# Patient Record
Sex: Female | Born: 1990
Health system: Southern US, Community
[De-identification: ages and names within clinical notes are randomized; demographics above are authoritative.]

## PROBLEM LIST (undated history)

## (undated) DIAGNOSIS — K0889 Other specified disorders of teeth and supporting structures: Secondary | ICD-10-CM

## (undated) DIAGNOSIS — O24419 Gestational diabetes mellitus in pregnancy, unspecified control: Secondary | ICD-10-CM

## (undated) DIAGNOSIS — A749 Chlamydial infection, unspecified: Secondary | ICD-10-CM

## (undated) DIAGNOSIS — N760 Acute vaginitis: Secondary | ICD-10-CM

## (undated) DIAGNOSIS — Z789 Other specified health status: Secondary | ICD-10-CM

## (undated) DIAGNOSIS — B9689 Other specified bacterial agents as the cause of diseases classified elsewhere: Secondary | ICD-10-CM

## (undated) HISTORY — DX: Other specified disorders of teeth and supporting structures: K08.89

## (undated) HISTORY — PX: NO PAST SURGERIES: SHX2092

## (undated) HISTORY — DX: Gestational diabetes mellitus in pregnancy, unspecified control: O24.419

---
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
## 2011-01-05 NOTE — Consult Note (Signed)
Summary: [facility]  [facility]   Imported By: Maryln Gottron 10/28/2010 13:47:49  _____________________________________________________________________  External Attachment:    Type:   Image     Comment:   External Document

## 2011-11-25 ENCOUNTER — Ambulatory Visit (INDEPENDENT_AMBULATORY_CARE_PROVIDER_SITE_OTHER): Payer: BC Managed Care – PPO | Admitting: Family Medicine

## 2011-11-25 ENCOUNTER — Ambulatory Visit: Payer: Self-pay | Admitting: Family Medicine

## 2011-11-25 ENCOUNTER — Encounter: Payer: Self-pay | Admitting: Family Medicine

## 2011-11-25 DIAGNOSIS — N898 Other specified noninflammatory disorders of vagina: Secondary | ICD-10-CM

## 2011-11-25 DIAGNOSIS — A499 Bacterial infection, unspecified: Secondary | ICD-10-CM

## 2011-11-25 DIAGNOSIS — B9689 Other specified bacterial agents as the cause of diseases classified elsewhere: Secondary | ICD-10-CM

## 2011-11-25 DIAGNOSIS — N760 Acute vaginitis: Secondary | ICD-10-CM

## 2011-11-25 MED ORDER — METRONIDAZOLE 0.75 % VA GEL: 1.0000 | Freq: Every day | VAGINAL | Status: AC

## 2011-11-25 NOTE — Progress Notes (Signed)
  Subjective:    Patient ID: Grace [name], female    DOB: 05/13/1991, 20 y.o.   MRN: [id number]  Vaginal Discharge The patient's primary symptoms include a vaginal discharge.   20 year old [place] American female in with complaints of vaginal discharge has been going on for 2 weeks. Denies any concerns or any sexually transmitted diseases. However she is sexually active with one female partner. In she denied any bleeding between her menstrual cycle. She is on Depo-Provera, no concerns of pregnancy. She has a history of bacterial vaginosis.   Review of Systems  Constitutional: Negative.   Respiratory: Negative.   Cardiovascular: Negative.   Gastrointestinal: Negative.   Genitourinary: Positive for vaginal discharge.       Fishy vaginal discharge  Psychiatric/Behavioral: Negative.    No past medical history on file.  History   Social History  . Marital Status: Single    Spouse Name: N/A    Number of Children: N/A  . Years of Education: N/A   Occupational History  . Not on file.   Social History Main Topics  . Smoking status: Former Smoker    Quit date: 10/28/2011  . Smokeless tobacco: Not on file   Comment: pt notes that she quit about 1 month ago  . Alcohol Use: Yes     twice a month  . Drug Use: No  . Sexually Active: Not on file   Other Topics Concern  . Not on file   Social History Narrative  . No narrative on file    No past surgical history on file.  No family history on file.  No Known Allergies  No current outpatient prescriptions on file prior to visit.    BP 100/64  Temp(Src) 97.7 F (36.5 C) (Oral)  Ht 5' 4.5" (1.638 m)  Wt 112 lb (50.803 kg)  BMI 18.93 kg/m2chart    Objective:   Physical Exam  Constitutional: She is oriented to person, place, and time. She appears well-developed and well-nourished.  Neck: Normal range of motion. Neck supple.  Cardiovascular: Normal rate and regular rhythm.   Pulmonary/Chest: Effort normal and breath sounds  normal.  Genitourinary: No bleeding around the vagina. Vaginal discharge found.       Frothy white vaginal discharge, malodorous   Musculoskeletal: Normal range of motion.  Neurological: She is alert and oriented to person, place, and time.          Assessment & Plan:  Assessment: Vaginal discharge, bacterial vaginosis  Plan: MetroGel vaginal one applicator at bedtime each bedtime x5 nights. No alcohol use during this time. Safe sex practices.

## 2011-11-25 NOTE — Patient Instructions (Signed)
Bacterial Vaginosis Bacterial vaginosis (BV) is a vaginal infection where the normal balance of bacteria in the vagina is disrupted. The normal balance is then replaced by an overgrowth of certain bacteria. There are several different kinds of bacteria that can cause BV. BV is the most common vaginal infection in women of childbearing age. CAUSES   The cause of BV is not fully understood. BV develops when there is an increase or imbalance of harmful bacteria.   Some activities or behaviors can upset the normal balance of bacteria in the vagina and put women at increased risk including:   Having a new sex partner or multiple sex partners.   Douching.   Using an intrauterine device (IUD) for contraception.   It is not clear what role sexual activity plays in the development of BV. However, women that have never had sexual intercourse are rarely infected with BV.  Women do not get BV from toilet seats, bedding, swimming pools or from touching objects around them.  SYMPTOMS   Grey vaginal discharge.   A fish-like odor with discharge, especially after sexual intercourse.   Itching or burning of the vagina and vulva.   Burning or pain with urination.   Some women have no signs or symptoms at all.  DIAGNOSIS  Your caregiver must examine the vagina for signs of BV. Your caregiver will perform lab tests and look at the sample of vaginal fluid through a microscope. They will look for bacteria and abnormal cells (clue cells), a pH test higher than 4.5, and a positive amine test all associated with BV.  RISKS AND COMPLICATIONS   Pelvic inflammatory disease (PID).   Infections following gynecology surgery.   Developing HIV.   Developing herpes virus.  TREATMENT  Sometimes BV will clear up without treatment. However, all women with symptoms of BV should be treated to avoid complications, especially if gynecology surgery is planned. Female partners generally do not need to be treated. However,  BV may spread between female sex partners so treatment is helpful in preventing a recurrence of BV.   BV may be treated with antibiotics. The antibiotics come in either pill or vaginal cream forms. Either can be used with nonpregnant or pregnant women, but the recommended dosages differ. These antibiotics are not harmful to the baby.   BV can recur after treatment. If this happens, a second round of antibiotics will often be prescribed.   Treatment is important for pregnant women. If not treated, BV can cause a premature delivery, especially for a pregnant woman who had a premature birth in the past. All pregnant women who have symptoms of BV should be checked and treated.   For chronic reoccurrence of BV, treatment with a type of prescribed gel vaginally twice a week is helpful.  HOME CARE INSTRUCTIONS   Finish all medication as directed by your caregiver.   Do not have sex until treatment is completed.   Tell your sexual partner that you have a vaginal infection. They should see their caregiver and be treated if they have problems, such as a mild rash or itching.   Practice safe sex. Use condoms. Only have 1 sex partner.  PREVENTION  Basic prevention steps can help reduce the risk of upsetting the natural balance of bacteria in the vagina and developing BV:  Do not have sexual intercourse (be abstinent).   Do not douche.   Use all of the medicine prescribed for treatment of BV, even if the signs and symptoms go away.     Tell your sex partner if you have BV. That way, they can be treated, if needed, to prevent reoccurrence.  SEEK MEDICAL CARE IF:   Your symptoms are not improving after 3 days of treatment.   You have increased discharge, pain, or fever.  MAKE SURE YOU:   Understand these instructions.   Will watch your condition.   Will get help right away if you are not doing well or get worse.  FOR MORE INFORMATION  Division of STD Prevention (DSTDP), Centers for Disease  Control and Prevention: www.cdc.gov/std American Social Health Association (ASHA): www.ashastd.org  Document Released: 11/22/2005 Document Revised: 08/04/2011 Document Reviewed: 05/15/2009 ExitCare Patient Information 2012 [employer]. 

## 2011-11-25 NOTE — Progress Notes (Signed)
Addended by: Beverely Low on: 11/25/2011 03:30 PM   Modules accepted: Orders

## 2011-11-26 LAB — WET PREP BY MOLECULAR PROBE: Trichomonas vaginosis: NEGATIVE

## 2011-11-28 ENCOUNTER — Encounter (HOSPITAL_COMMUNITY): Payer: Self-pay | Admitting: *Deleted

## 2011-11-28 ENCOUNTER — Emergency Department (HOSPITAL_COMMUNITY)
Admission: EM | Admit: 2011-11-28 | Discharge: 2011-11-28 | Discharge status: Against Medical Advice | Payer: BC Managed Care – PPO | Attending: Emergency Medicine | Admitting: Emergency Medicine

## 2011-11-28 DIAGNOSIS — R110 Nausea: Secondary | ICD-10-CM | POA: Insufficient documentation

## 2011-11-28 DIAGNOSIS — R109 Unspecified abdominal pain: Secondary | ICD-10-CM | POA: Insufficient documentation

## 2011-11-28 HISTORY — DX: Other specified bacterial agents as the cause of diseases classified elsewhere: N76.0

## 2011-11-28 HISTORY — DX: Other specified bacterial agents as the cause of diseases classified elsewhere: B96.89

## 2011-11-28 NOTE — ED Notes (Signed)
Informed md and pa patient decided to just go home.

## 2011-11-28 NOTE — ED Notes (Signed)
Pt is having lower abdominal pain and nausea with no vomitus.  Pt denies diarrhea.  Pt states that the pain is generalized across her lower abdomen.  Pt has recently been diagnosed with bacterial vaginosis and is being treated with flagyl gel.  Pt has not yet finished her treatment.

## 2011-12-07 LAB — GC/CHLAMYDIA PROBE AMP, GENITAL: Chlamydia, DNA Probe: POSITIVE — AB

## 2011-12-10 ENCOUNTER — Telehealth: Payer: Self-pay | Admitting: Family

## 2011-12-10 MED ORDER — DOXYCYCLINE HYCLATE 100 MG PO TABS: 100.0000 mg | ORAL_TABLET | Freq: Two times a day (BID) | ORAL | Status: DC

## 2011-12-10 NOTE — Telephone Encounter (Signed)
Pt aware and Rx sent to pharmacy. Pt verbalized understanding in sex education. Report faxed to [facility]

## 2011-12-10 NOTE — Telephone Encounter (Signed)
Doxycycline 100mg  1 po BID x 7 days # 14 no refills. Please advise that she has chlamydia. She should notify all sex partners. Refrain for sexual intercourse. Reports to the [facility].

## 2011-12-15 ENCOUNTER — Ambulatory Visit (INDEPENDENT_AMBULATORY_CARE_PROVIDER_SITE_OTHER): Payer: BC Managed Care – PPO | Admitting: Family

## 2011-12-15 ENCOUNTER — Encounter: Payer: Self-pay | Admitting: Family

## 2011-12-15 VITALS — Temp 98.4°F | Wt 112.0 lb

## 2011-12-15 DIAGNOSIS — G5600 Carpal tunnel syndrome, unspecified upper limb: Secondary | ICD-10-CM

## 2011-12-15 MED ORDER — DOXYCYCLINE HYCLATE 100 MG PO TABS: 100.0000 mg | ORAL_TABLET | Freq: Two times a day (BID) | ORAL | Status: AC

## 2011-12-15 NOTE — Progress Notes (Signed)
  Subjective:    Patient ID: Grace [name], female    DOB: 28-Mar-1991, 21 y.o.   MRN: [id number]  HPI In 21 year old African American female in with complaints left hand numbness and tingling when she woke up this morning. The numbness and tingling has improved. She is able to use her left hand and arm. She believes that she may have slept on it. She has not taken anything for relief. Denies any increase in activity with her left pain in, no sports activities, no increase in computer work. Patient denies any lightheadedness, dizziness, shortness of breath, chest pain, palpitations or edema.  The patient was recently diagnosed with Chlamydia, and has not had antibiotic filled due to cost. She will occur medication called him to [facility].  Review of Systems  Constitutional: Negative.   HENT: Negative.   Eyes: Negative.   Respiratory: Negative.   Cardiovascular: Negative.   Gastrointestinal: Negative.   Genitourinary: Negative.   Musculoskeletal: Negative.   Neurological: Negative.   Hematological: Negative.   Psychiatric/Behavioral: Negative.    Past Medical History  Diagnosis Date  . Bacterial vaginosis     History   Social History  . Marital Status: Single    Spouse Name: N/A    Number of Children: N/A  . Years of Education: N/A   Occupational History  . Not on file.   Social History Main Topics  . Smoking status: Former Smoker    Quit date: 10/28/2011  . Smokeless tobacco: Not on file   Comment: pt notes that she quit about 1 month ago  . Alcohol Use: Yes     twice a month  . Drug Use: No  . Sexually Active: Not on file   Other Topics Concern  . Not on file   Social History Narrative  . No narrative on file    No past surgical history on file.  Family History  Problem Relation Age of Onset  . Arthritis Maternal Grandmother   . Heart disease Maternal Grandmother   . Stroke Maternal Grandmother   . Hypertension Maternal Grandmother     No Known  Allergies  Current Outpatient Prescriptions on File Prior to Visit  Medication Sig Dispense Refill  . medroxyPROGESTERone (DEPO-PROVERA) 150 MG/ML injection Inject 150 mg into the muscle every 3 (three) months.         Temp(Src) 98.4 F (36.9 C) (Oral)  Wt 112 lb (50.803 kg)  LMP 12/23/2012chart    Objective:   Physical Exam  Constitutional: She is oriented to person, place, and time. She appears well-developed and well-nourished.  Neck: Normal range of motion. Neck supple.  Cardiovascular: Normal rate, regular rhythm and normal heart sounds.   Pulmonary/Chest: Effort normal and breath sounds normal.  Musculoskeletal: Normal range of motion.  Neurological: She is alert and oriented to person, place, and time.  Skin: Skin is warm and dry.  Psychiatric: She has a normal mood and affect.          Assessment & Plan:  Assessment: Carpal tunnel syndrome  Plan: Aleve over-the-counter twice daily. Patient to call if symptoms worsen or persist. Recheck as scheduled and when necessary will. Prescription for doxycycline written a cover Chlamydia. Spoke with patient at length about safe sex practices and sexually transmitted diseases. She is to avoid sexual contact for one week. Take antibiotics completely.

## 2011-12-15 NOTE — Patient Instructions (Signed)
Carpal Tunnel Syndrome The carpal tunnel is a narrow hollow area in the wrist. It is formed by the wrist bones and ligaments. Nerves, blood vessels, and tendons (cord like structures which attach muscle to bone) on the palm side (the side of your hand in the direction your fingers bend) of your hand pass through the carpal tunnel. Repeated wrist motion or certain diseases may cause swelling within the tunnel. (That is why these are called repetitive trauma (damage caused by over use) disorders. It is also a common problem in late pregnancy.) This swelling pinches the main nerve in the wrist (median nerve) and causes the painful condition called carpal tunnel syndrome. A feeling of "pins and needles" may be noticed in the fingers or hand; however, the entire arm may ache from this condition. Carpal tunnel syndrome may clear up by itself. Cortisone injections may help. Sometimes, an operation may be needed to free the pinched nerve. An electromyogram (a type of test) may be needed to confirm this diagnosis (learning what is wrong). This is a test which measures nerve conduction. The nerve conduction is usually slowed in a carpal tunnel syndrome. HOME CARE INSTRUCTIONS   If your caregiver prescribed medication to help reduce swelling, take as directed.   If you were given a splint to keep your wrist from bending, use it as instructed. It is important to wear the splint at night. Use the splint for as long as you have pain or numbness in your hand, arm or wrist. This may take 1 to 2 months.   If you have pain at night, it may help to rub or shake your hand, or elevate your hand above the level of your heart (the center of your chest).   It is important to give your wrist a rest by stopping the activities that are causing the problem. If your symptoms (problems) are work-related, you may need to talk to your employer about changing to a job that does not require using your wrist.   Only take over-the-counter  or prescription medicines for pain, discomfort, or fever as directed by your caregiver.   Following periods of extended use, particularly strenuous use, apply an ice pack wrapped in a towel to the anterior (palm) side of the affected wrist for 20 to 30 minutes. Repeat as needed three to four times per day. This will help reduce the swelling.   Follow all instructions for follow-up with your caregiver. This includes any orthopedic referrals, physical therapy, and rehabilitation. Any delay in obtaining necessary care could result in a delay or failure of your condition to heal.  SEEK IMMEDIATE MEDICAL CARE IF:   You are still having pain and numbness following a week of treatment.   You develop new, unexplained symptoms.   Your current symptoms are getting worse and are not helped or controlled with medications.  MAKE SURE YOU:   Understand these instructions.   Will watch your condition.   Will get help right away if you are not doing well or get worse.  Document Released: 11/19/2000 Document Revised: 08/04/2011 Document Reviewed: 06/26/2008 ExitCare Patient Information 2012 [employer]. 

## 2011-12-20 ENCOUNTER — Encounter: Payer: BC Managed Care – PPO | Admitting: Family

## 2011-12-20 DIAGNOSIS — Z0289 Encounter for other administrative examinations: Secondary | ICD-10-CM

## 2012-07-08 ENCOUNTER — Encounter (HOSPITAL_COMMUNITY): Payer: Self-pay | Admitting: *Deleted

## 2012-07-08 ENCOUNTER — Emergency Department (INDEPENDENT_AMBULATORY_CARE_PROVIDER_SITE_OTHER)
Admission: EM | Admit: 2012-07-08 | Discharge: 2012-07-08 | Disposition: A | Discharge status: Home / Self Care | Payer: BC Managed Care – PPO | Source: Home / Self Care | Attending: Emergency Medicine | Admitting: Emergency Medicine

## 2012-07-08 DIAGNOSIS — A499 Bacterial infection, unspecified: Secondary | ICD-10-CM

## 2012-07-08 DIAGNOSIS — B9689 Other specified bacterial agents as the cause of diseases classified elsewhere: Secondary | ICD-10-CM

## 2012-07-08 DIAGNOSIS — N760 Acute vaginitis: Secondary | ICD-10-CM

## 2012-07-08 HISTORY — DX: Chlamydial infection, unspecified: A74.9

## 2012-07-08 LAB — POCT URINALYSIS DIP (DEVICE)
Bilirubin Urine: NEGATIVE
Glucose, UA: NEGATIVE mg/dL
Hgb urine dipstick: NEGATIVE
Leukocytes, UA: NEGATIVE
Nitrite: NEGATIVE
Urobilinogen, UA: 1 mg/dL (ref 0.0–1.0)
pH: 7 (ref 5.0–8.0)

## 2012-07-08 LAB — WET PREP, GENITAL: Trich, Wet Prep: NONE SEEN

## 2012-07-08 MED ORDER — CEFTRIAXONE SODIUM 250 MG IJ SOLR
INTRAMUSCULAR | Status: AC
  Filled 2012-07-08: qty 250

## 2012-07-08 MED ORDER — LIDOCAINE HCL (PF) 1 % IJ SOLN
INTRAMUSCULAR | Status: AC
  Filled 2012-07-08: qty 5

## 2012-07-08 MED ORDER — METRONIDAZOLE 500 MG PO TABS: 500.0000 mg | ORAL_TABLET | Freq: Two times a day (BID) | ORAL | Status: AC

## 2012-07-08 MED ORDER — AZITHROMYCIN 250 MG PO TABS: 1000.0000 mg | ORAL_TABLET | Freq: Once | ORAL | Status: DC

## 2012-07-08 MED ORDER — CEFTRIAXONE SODIUM 250 MG IJ SOLR: 250.0000 mg | Freq: Once | INTRAMUSCULAR | Status: DC

## 2012-07-08 MED ORDER — AZITHROMYCIN 250 MG PO TABS
ORAL_TABLET | ORAL | Status: AC
  Filled 2012-07-08: qty 4

## 2012-07-08 NOTE — ED Notes (Signed)
Pt is here with complaints of vaginal pain with a creamy white discharge that started 2 days ago.  Pt says she is "unsure" of the last time she had unprotected sex.  History of chlamydia 5 months ago.  Denies additional symptoms.

## 2012-07-08 NOTE — ED Provider Notes (Signed)
History     CSN: [id number]  Arrival date & time 07/08/12  1520   First MD Initiated Contact with Patient 07/08/12 1556      Chief Complaint  Patient presents with  . Vaginal Pain    (Consider location/radiation/quality/duration/timing/severity/associated sxs/prior treatment) HPI Comments: Pt with 2 days oderous vaginal discharge,  No urgency, frequency, dysuria, oderous urine, hematuria,  genital blisters, vaginal itching. No fevers, N/V, abd pain, back pain. No recent abx use. Pt states that she has had no sexual contact since April.  STD's not a concern today. Similar sx before when had BV. Also history of Chlamydia.  No h/o gonorrhea, Trichomonas, yeast infection. syphilis, herpes, HIV.  ROS as noted in HPI. All other ROS negative.     Patient is a 21 y.o. female presenting with vaginal discharge. The history is provided by the patient. No language interpreter was used.  Vaginal Discharge This is a new problem. The current episode started more than 2 days ago. The problem occurs constantly. The problem has not changed since onset.Pertinent negatives include no abdominal pain. Nothing aggravates the symptoms. Nothing relieves the symptoms. She has tried nothing for the symptoms. The treatment provided no relief.    Past Medical History  Diagnosis Date  . Bacterial vaginosis   . Chlamydia     History reviewed. No pertinent past surgical history.  Family History  Problem Relation Age of Onset  . Arthritis Maternal Grandmother   . Heart disease Maternal Grandmother   . Stroke Maternal Grandmother   . Hypertension Maternal Grandmother     History  Substance Use Topics  . Smoking status: Former Smoker    Quit date: 10/28/2011  . Smokeless tobacco: Not on file   Comment: pt notes that she quit about 1 month ago  . Alcohol Use: Yes     twice a month    OB History    Grav Para Term Preterm Abortions TAB SAB Ect Mult Living                  Review of Systems    Gastrointestinal: Negative for abdominal pain.  Genitourinary: Positive for vaginal discharge.    Allergies  Review of patient's allergies indicates no known allergies.  Home Medications   Current Outpatient Rx  Name Route Sig Dispense Refill  . METRONIDAZOLE 500 MG PO TABS Oral Take 1 tablet (500 mg total) by mouth 2 (two) times daily. X 7 days 14 tablet 0    BP 124/87  Pulse 65  Temp 98.7 F (37.1 C) (Oral)  Resp 16  SpO2 100%  LMP 06/23/2012  Physical Exam  Nursing note and vitals reviewed. Constitutional: She is oriented to person, place, and time. She appears well-developed and well-nourished. No distress.  HENT:  Head: Normocephalic and atraumatic.  Eyes: EOM are normal.  Neck: Normal range of motion.  Cardiovascular: Normal rate.   Pulmonary/Chest: Effort normal.  Abdominal: Soft. Bowel sounds are normal. She exhibits no distension. There is no tenderness. There is no rebound, no guarding and no CVA tenderness.  Genitourinary: Uterus normal. Pelvic exam was performed with patient supine. There is no rash on the right labia. There is no rash on the left labia. Uterus is not tender. Cervix exhibits no motion tenderness and no friability. Right adnexum displays no mass, no tenderness and no fullness. Left adnexum displays no mass, no tenderness and no fullness. No erythema, tenderness or bleeding around the vagina. No foreign body around the vagina. Vaginal discharge  found.       Thin  white oderous  vaginal d/c.Chaperone present during exam  Musculoskeletal: Normal range of motion.  Neurological: She is alert and oriented to person, place, and time.  Skin: Skin is warm and dry.  Psychiatric: She has a normal mood and affect. Her behavior is normal. Judgment and thought content normal.    ED Course  Procedures (including critical care time)  Labs Reviewed  POCT URINALYSIS DIP (DEVICE) - Abnormal; Notable for the following:    Protein, ur 30 (*)     All other  components within normal limits  POCT PREGNANCY, URINE  WET PREP, GENITAL  GC/CHLAMYDIA PROBE AMP, GENITAL   No results found.   1. Bacterial vaginosis    Results for orders placed during the hospital encounter of 07/08/12  POCT URINALYSIS DIP (DEVICE)      Component Value Range   Glucose, UA NEGATIVE  NEGATIVE mg/dL   Bilirubin Urine NEGATIVE  NEGATIVE   Ketones, ur NEGATIVE  NEGATIVE mg/dL   Specific Gravity, Urine 1.020  1.005 - 1.030   Hgb urine dipstick NEGATIVE  NEGATIVE   pH 7.0  5.0 - 8.0   Protein, ur 30 (*) NEGATIVE mg/dL   Urobilinogen, UA 1.0  0.0 - 1.0 mg/dL   Nitrite NEGATIVE  NEGATIVE   Leukocytes, UA NEGATIVE  NEGATIVE  POCT PREGNANCY, URINE      Component Value Range   Preg Test, Ur NEGATIVE  NEGATIVE  WET PREP, GENITAL      Component Value Range   Yeast Wet Prep HPF POC NONE SEEN  NONE SEEN   Trich, Wet Prep NONE SEEN  NONE SEEN   Clue Cells Wet Prep HPF POC FEW (*) NONE SEEN   WBC, Wet Prep HPF POC NONE SEEN  NONE SEEN     MDM    H&P most c/wBV  Sent off GC/chlamydia, wet prep. Will not treat empirically now as pt denies sexual contact for 4 months. Has h/o BV. Will send home with flagyl. Advised pt to refrain from sexual contact until she he knows lab results, symptoms resolve, and partner(s) are treated. Pt provided working phone number. Pt agrees.     Luiz Blare, MD 07/08/12 2233

## 2012-07-10 LAB — GC/CHLAMYDIA PROBE AMP, GENITAL
Chlamydia, DNA Probe: NEGATIVE
GC Probe Amp, Genital: NEGATIVE

## 2012-07-21 ENCOUNTER — Telehealth (HOSPITAL_COMMUNITY): Payer: Self-pay | Admitting: *Deleted

## 2012-07-21 MED ORDER — FLUCONAZOLE 150 MG PO TABS: ORAL_TABLET | ORAL | Status: DC

## 2012-07-21 NOTE — ED Notes (Signed)
Pt. called and said she finished the Flagyl and now she has a yeast infection. She said this happened the last time she took it.  I told her I would call back.  Discussed with Dr. Tressia Danas and she ordered Diflucan 150 mg. PO now and repeat in 72 hrs.  She e-prescribed it to [facility] on [address] which was in system as her preferred pharmacy.  Pt had asked me to send it to [facility] on [facility] when she called. I tried to call her back to tell her where to pick it up and had to leave a message for her to call. Vassie Moselle 07/21/2012

## 2012-10-18 ENCOUNTER — Ambulatory Visit (INDEPENDENT_AMBULATORY_CARE_PROVIDER_SITE_OTHER): Payer: BC Managed Care – PPO | Admitting: Family

## 2012-10-18 ENCOUNTER — Encounter: Payer: Self-pay | Admitting: Family

## 2012-10-18 ENCOUNTER — Telehealth: Payer: Self-pay | Admitting: Family

## 2012-10-18 VITALS — BP 112/60 | HR 61 | Temp 98.0°F | Wt 114.0 lb

## 2012-10-18 DIAGNOSIS — L309 Dermatitis, unspecified: Secondary | ICD-10-CM

## 2012-10-18 DIAGNOSIS — L259 Unspecified contact dermatitis, unspecified cause: Secondary | ICD-10-CM

## 2012-10-18 DIAGNOSIS — B86 Scabies: Secondary | ICD-10-CM

## 2012-10-18 MED ORDER — TRIAMCINOLONE 0.1 % CREAM:EUCERIN CREAM 1:1: 1.0000 "application " | TOPICAL_CREAM | Freq: Two times a day (BID) | CUTANEOUS | Status: DC

## 2012-10-18 MED ORDER — PERMETHRIN 5 % EX CREA: TOPICAL_CREAM | Freq: Once | CUTANEOUS | Status: DC

## 2012-10-18 NOTE — Telephone Encounter (Signed)
Pt mother call back and can not afford itching medication cost 91.00. Per NP patient can use Nix otc. Pt mother is aware

## 2012-10-18 NOTE — Telephone Encounter (Signed)
[facility] called and needs clarification on the Qty and the Compound for Triamcinolone Acetonide (TRIAMCINOLONE 0.1 % CREAM : EUCERIN) CREA

## 2012-10-18 NOTE — Addendum Note (Signed)
Addended by: Beverely Low on: 10/18/2012 02:42 PM   Modules accepted: Orders

## 2012-10-18 NOTE — Progress Notes (Signed)
  Subjective:    Patient ID: Grace [name], female    DOB: 1991-04-26, 21 y.o.   MRN: [id number]  HPI 21 year old [place] American female, nonsmoker is in with complaints of itchy, bumpy rash has been present x2 weeks. The rash is worsening. Her boyfriend has a similar rash. She is concerned of scabies. Has had it many years ago.  Also has a rash to the neck that is dry, itchy, and flaking. She has not tried any OTC medications for relief. No history of allergies.    Review of Systems  Constitutional: Negative.   Respiratory: Negative.   Cardiovascular: Negative.   Musculoskeletal: Negative.   Skin: Positive for rash.       Itchy rash to the webs of the hands, feet, and right waste line.   Patchy rash to the neck   Psychiatric/Behavioral: Negative.    Past Medical History  Diagnosis Date  . Bacterial vaginosis   . Chlamydia     History   Social History  . Marital Status: Single    Spouse Name: N/A    Number of Children: N/A  . Years of Education: N/A   Occupational History  . Not on file.   Social History Main Topics  . Smoking status: Former Smoker    Quit date: 10/28/2011  . Smokeless tobacco: Not on file     Comment: pt notes that she quit about 1 month ago  . Alcohol Use: Yes     Comment: twice a month  . Drug Use: No  . Sexually Active: Yes     Comment: no sxual contact since April 2013   Other Topics Concern  . Not on file   Social History Narrative  . No narrative on file    No past surgical history on file.  Family History  Problem Relation Age of Onset  . Arthritis Maternal Grandmother   . Heart disease Maternal Grandmother   . Stroke Maternal Grandmother   . Hypertension Maternal Grandmother     No Known Allergies  Current Outpatient Prescriptions on File Prior to Visit  Medication Sig Dispense Refill  . fluconazole (DIFLUCAN) 150 MG tablet 1 tab po now repeat in 72h  2 tablet  0  . [DISCONTINUED] medroxyPROGESTERone (DEPO-PROVERA) 150  MG/ML injection Inject 150 mg into the muscle every 3 (three) months.         BP 112/60  Pulse 61  Temp 98 F (36.7 C) (Oral)  Wt 114 lb (51.71 kg)  SpO2 98%chart    Objective:   Physical Exam  Constitutional: She is oriented to person, place, and time. She appears well-developed and well-nourished.  Neck: Normal range of motion. Neck supple.  Cardiovascular: Normal rate, regular rhythm and normal heart sounds.   Pulmonary/Chest: Effort normal and breath sounds normal.  Abdominal: Soft. Bowel sounds are normal.  Neurological: She is alert and oriented to person, place, and time.  Skin: Rash noted.     Psychiatric: She has a normal mood and affect.          Assessment & Plan:  Assessment: Scabies, Eczema   Plan: Elimite apply to the entire surface of the body from head to toe, leave 8-12 hours then rinse. Triamcinolone and Eucerin cream 1:1 cream to the AA twice a day. Call the office if symptoms worsen or persist. Scabies precautions given.

## 2012-10-18 NOTE — Patient Instructions (Addendum)
Scabies Scabies are small bugs (mites) that burrow under the skin and cause red bumps and severe itching. These bugs can only be seen with a microscope. Scabies are highly contagious. They can spread easily from person to person by direct contact. They are also spread through sharing clothing or linens that have the scabies mites living in them. It is not unusual for an entire family to become infected through shared towels, clothing, or bedding.  HOME CARE INSTRUCTIONS   Your caregiver may prescribe a cream or lotion to kill the mites. If cream is prescribed, massage the cream into the entire body from the neck to the bottom of both feet. Also massage the cream into the scalp and face if your child is less than 1 year old. Avoid the eyes and mouth. Do not wash your hands after application.  Leave the cream on for 8 to 12 hours. Your child should bathe or shower after the 8 to 12 hour application period. Sometimes it is helpful to apply the cream to your child right before bedtime.  One treatment is usually effective and will eliminate approximately 95% of infestations. For severe cases, your caregiver may decide to repeat the treatment in 1 week. Everyone in your household should be treated with one application of the cream.  New rashes or burrows should not appear within 24 to 48 hours after successful treatment. However, the itching and rash may last for 2 to 4 weeks after successful treatment. Your caregiver may prescribe a medicine to help with the itching or to help the rash go away more quickly.  Scabies can live on clothing or linens for up to 3 days. All of your child's recently used clothing, towels, stuffed toys, and bed linens should be washed in hot water and then dried in a dryer for at least 20 minutes on high heat. Items that cannot be washed should be enclosed in a plastic bag for at least 3 days.  To help relieve itching, bathe your child in a cool bath or apply cool washcloths to the  affected areas.  Your child may return to school after treatment with the prescribed cream. SEEK MEDICAL CARE IF:   The itching persists longer than 4 weeks after treatment.  The rash spreads or becomes infected. Signs of infection include red blisters or yellow-tan crust. Document Released: 11/22/2005 Document Revised: 02/14/2012 Document Reviewed: 04/02/2009 [employer] Patient Information 2013 [employer]. Eczema Atopic dermatitis, or eczema, is an inherited type of sensitive skin. Often people with eczema have a family history of allergies, asthma, or hay fever. It causes a red itchy rash and dry scaly skin. The itchiness may occur before the skin rash and may be very intense. It is not contagious. Eczema is generally worse during the cooler winter months and often improves with the warmth of summer. Eczema usually starts showing signs in infancy. Some children outgrow eczema, but it may last through adulthood. Flare-ups may be caused by:  Eating something or contact with something you are sensitive or allergic to.  Stress. DIAGNOSIS  The diagnosis of eczema is usually based upon symptoms and medical history. TREATMENT  Eczema cannot be cured, but symptoms usually can be controlled with treatment or avoidance of allergens (things to which you are sensitive or allergic to).  Controlling the itching and scratching.  Use over-the-counter antihistamines as directed for itching. It is especially useful at night when the itching tends to be worse.  Use over-the-counter steroid creams as directed for itching.    Scratching makes the rash and itching worse and may cause impetigo (a skin infection) if fingernails are contaminated (dirty).  Keeping the skin well moisturized with creams every day. This will seal in moisture and help prevent dryness. Lotions containing alcohol and water can dry the skin and are not recommended.  Limiting exposure to allergens.  Recognizing situations that  cause stress.  Developing a plan to manage stress. HOME CARE INSTRUCTIONS   Take prescription and over-the-counter medicines as directed by your caregiver.  Do not use anything on the skin without checking with your caregiver.  Keep baths or showers short (5 minutes) in warm (not hot) water. Use mild cleansers for bathing. You may add non-perfumed bath oil to the bath water. It is best to avoid soap and bubble bath.  Immediately after a bath or shower, when the skin is still damp, apply a moisturizing ointment to the entire body. This ointment should be a petroleum ointment. This will seal in moisture and help prevent dryness. The thicker the ointment the better. These should be unscented.  Keep fingernails cut short and wash hands often. If your child has eczema, it may be necessary to put soft gloves or mittens on your child at night.  Dress in clothes made of cotton or cotton blends. Dress lightly, as heat increases itching.  Avoid foods that may cause flare-ups. Common foods include cow's milk, peanut butter, eggs and wheat.  Keep a child with eczema away from anyone with fever blisters. The virus that causes fever blisters (herpes simplex) can cause a serious skin infection in children with eczema. SEEK MEDICAL CARE IF:   Itching interferes with sleep.  The rash gets worse or is not better within one week following treatment.  The rash looks infected (pus or soft yellow scabs).  You or your child has an oral temperature above 102 F (38.9 C).  Your baby is older than 3 months with a rectal temperature of 100.5 F (38.1 C) or higher for more than 1 day.  The rash flares up after contact with someone who has fever blisters. SEEK IMMEDIATE MEDICAL CARE IF:   Your baby is older than 3 months with a rectal temperature of 102 F (38.9 C) or higher.  Your baby is older than 3 months or younger with a rectal temperature of 100.4 F (38 C) or higher. Document Released:  11/19/2000 Document Revised: 02/14/2012 Document Reviewed: 09/24/2009 ExitCare Patient Information 2013 [employer].  

## 2012-10-19 ENCOUNTER — Telehealth: Payer: Self-pay | Admitting: Family

## 2012-10-19 NOTE — Telephone Encounter (Signed)
Caller: [name]/Patient; Patient Name: Grace [name]; PCP: [facility]); Best Callback Phone Number: [phone];  Calling regarding her scabies treatment wants to make sure she did it right, she was given enough for her boyfriend and he is  to do the treatment tonight on 10/19/12. States she did the treatment on 10/18/12, health information given on Scabies and she has done the treatment correct and home measures. Advised to call back if s/s worsen.

## 2012-10-20 ENCOUNTER — Other Ambulatory Visit: Payer: Self-pay

## 2012-10-20 ENCOUNTER — Ambulatory Visit: Payer: BC Managed Care – PPO | Admitting: Family

## 2012-11-20 ENCOUNTER — Ambulatory Visit (INDEPENDENT_AMBULATORY_CARE_PROVIDER_SITE_OTHER): Payer: BC Managed Care – PPO | Admitting: Family

## 2012-11-20 ENCOUNTER — Encounter: Payer: Self-pay | Admitting: Family

## 2012-11-20 VITALS — BP 110/80 | Temp 98.3°F | Wt 116.0 lb

## 2012-11-20 DIAGNOSIS — Z309 Encounter for contraceptive management, unspecified: Secondary | ICD-10-CM

## 2012-11-20 DIAGNOSIS — J309 Allergic rhinitis, unspecified: Secondary | ICD-10-CM

## 2012-11-20 MED ORDER — FLUTICASONE PROPIONATE 50 MCG/ACT NA SUSP: 2.0000 | Freq: Every day | NASAL | Status: DC

## 2012-11-20 MED ORDER — NORETHIN-ETH ESTRAD-FE BIPHAS 1 MG-10 MCG / 10 MCG PO TABS: 1.0000 | ORAL_TABLET | Freq: Every day | ORAL | Status: DC

## 2012-11-20 NOTE — Progress Notes (Signed)
Subjective:    Patient ID: Grace [name], female    DOB: 03-16-1991, 21 y.o.   MRN: [id number]  HPI 21 year old [place] American female, nonsmoker is in for birth control management today. She will like to try a more contraceptive pill. She's been on Depo-Provera in the past that caused her to bleed for several months.  Patient also has complaints of sneezing and nasal congestion off and on x2 weeks. She denies any fever, sinus pressure or pain. She's taken 2 sinus tablets over the last 2 weeks that helped.   LMP: 10/25/2012  Review of Systems  Constitutional: Negative.   HENT: Positive for congestion and sneezing. Negative for sore throat, postnasal drip and sinus pressure.   Respiratory: Negative.   Cardiovascular: Negative.   Gastrointestinal: Negative.   Genitourinary: Negative.   Musculoskeletal: Negative.   Skin: Negative.   Neurological: Negative.   Hematological: Negative.   Psychiatric/Behavioral: Negative.    Past Medical History  Diagnosis Date  . Bacterial vaginosis   . Chlamydia     History   Social History  . Marital Status: Single    Spouse Name: N/A    Number of Children: N/A  . Years of Education: N/A   Occupational History  . Not on file.   Social History Main Topics  . Smoking status: Former Smoker    Quit date: 10/28/2011  . Smokeless tobacco: Not on file     Comment: pt notes that she quit about 1 month ago  . Alcohol Use: Yes     Comment: twice a month  . Drug Use: No  . Sexually Active: Yes     Comment: no sxual contact since April 2013   Other Topics Concern  . Not on file   Social History Narrative  . No narrative on file    No past surgical history on file.  Family History  Problem Relation Age of Onset  . Arthritis Maternal Grandmother   . Heart disease Maternal Grandmother   . Stroke Maternal Grandmother   . Hypertension Maternal Grandmother     No Known Allergies  Current Outpatient Prescriptions on File Prior to  Visit  Medication Sig Dispense Refill  . fluconazole (DIFLUCAN) 150 MG tablet 1 tab po now repeat in 72h  2 tablet  0  . fluticasone (FLONASE) 50 MCG/ACT nasal spray Place 2 sprays into the nose daily.  16 g  6  . Norethindrone-Ethinyl Estradiol-Fe Biphas (LO LOESTRIN FE) 1 MG-10 MCG / 10 MCG tablet Take 1 tablet by mouth daily.  1 Package  11  . permethrin (ELIMITE) 5 % cream Apply topically once.  60 g  1  . Triamcinolone Acetonide (TRIAMCINOLONE 0.1 % CREAM : EUCERIN) CREA Apply 1 application topically 2 (two) times daily.  1 each  1  . [DISCONTINUED] medroxyPROGESTERone (DEPO-PROVERA) 150 MG/ML injection Inject 150 mg into the muscle every 3 (three) months.         BP 110/80  Temp 98.3 F (36.8 C) (Oral)  Wt 116 lb (52.617 kg)chart    Objective:   Physical Exam  Constitutional: She is oriented to person, place, and time. She appears well-developed and well-nourished.  HENT:  Right Ear: External ear normal.  Left Ear: External ear normal.  Nose: Nose normal.  Mouth/Throat: Oropharynx is clear and moist.       Right nasal turbinate is red and swollen.   Neck: Normal range of motion. Neck supple.  Cardiovascular: Normal rate, regular rhythm and normal heart sounds.  Pulmonary/Chest: Effort normal and breath sounds normal.  Neurological: She is alert and oriented to person, place, and time.  Skin: Skin is warm and dry.          Assessment & Plan:  Assessment: Allergic rhinitis, birth control management  Plan: We'll start Loestrin 24 once daily the Sunday after her next menstrual cycle. Flonase 2 sprays in each nostril once a day. Patient call the office if symptoms worsen or persist. Recheck as schedule, and when necessary.

## 2012-11-20 NOTE — Patient Instructions (Signed)
Oral Contraception Information Oral contraceptives (OCs) are medicines taken to prevent pregnancy. OCs work by preventing the ovaries from releasing eggs. The hormones in OCs also cause the cervical mucus to thicken, preventing the sperm from entering the uterus. The hormones also cause the uterine lining to become thin, not allowing a fertilized egg to attach to the inside of the uterus. OCs are highly effective when taken exactly as prescribed. However, OCs do not prevent sexually transmitted diseases (STDs). Safe sex practices, such as using condoms along with the pill, can help prevent STDs.  Before taking the pill, you may have a physical exam and Pap test. Your caregiver may order blood tests that may be necessary. Your caregiver will make sure you are a good candidate for oral contraception. Discuss with your caregiver the possible side effects of the OC you may be prescribed. When starting an OC, it can take 2 to 3 months for the body to adjust to the changes in hormone levels in your body.  TYPES OF ORAL CONTRACEPTION  The combination pill. This pill contains estrogen and progestin (synthetic progesterone) hormones. The combination pill comes in either 21-day or 28-day packs. With 21-day packs, you do not take pills for 7 days after the last pill. With 28-day packs, the pill is taken every day. The last 7 pills are without hormones. Certain types of pills have more than 21 hormone-containing pills.  The minipill. This pill contains the progesterone hormone only. It is taken every day continuously. The minipill comes in packs of 91 pills. The first 84 pills contain the hormones, and the last 7 pills do not. The last 7 days are when you will have your menstrual period. You may experience irregular spotting. ADVANTAGES  Decreases premenstrual symptoms.  Treats menstrual period cramps.  Regulates the menstrual cycle.  Decreases a heavy menstrual flow.  Treats acne.  Treats abnormal uterine  bleeding.  Treats chronic pelvic pain.  Treats polycystic ovarian syndrome.  Treats endometriosis.  Can be used as emergency contraception. DISADVANTAGES OCs can be less effective if:  You forget to take the pill at the same time every day.  You have a stomach or intestinal disease that lessens the absorption of the pill.  You take OCs with other medicines that make OCs less effective.  You take expired OCs.  You forget to restart the pill on day 7, when using the packs of 21 pills. Document Released: 02/12/2003 Document Revised: 02/14/2012 Document Reviewed: 03/31/2011 [facility] Patient Information 2013 [place], [place].   Allergic Rhinitis Allergic rhinitis is when the mucous membranes in the nose respond to allergens. Allergens are particles in the air that cause your body to have an allergic reaction. This causes you to release allergic antibodies. Through a chain of events, these eventually cause you to release histamine into the blood stream (hence the use of antihistamines). Although meant to be protective to the body, it is this release that causes your discomfort, such as frequent sneezing, congestion and an itchy runny nose.  CAUSES  The pollen allergens may come from grasses, trees, and weeds. This is seasonal allergic rhinitis, or "hay fever." Other allergens cause year-round allergic rhinitis (perennial allergic rhinitis) such as house dust mite allergen, pet dander and mold spores.  SYMPTOMS   Nasal stuffiness (congestion).  Runny, itchy nose with sneezing and tearing of the eyes.  There is often an itching of the mouth, eyes and ears. It cannot be cured, but it can be controlled with medications. DIAGNOSIS  If you are unable to determine the offending allergen, skin or blood testing may find it. TREATMENT   Avoid the allergen.  Medications and allergy shots (immunotherapy) can help.  Hay fever may often be treated with antihistamines in pill or nasal spray  forms. Antihistamines block the effects of histamine. There are over-the-counter medicines that may help with nasal congestion and swelling around the eyes. Check with your caregiver before taking or giving this medicine. If the treatment above does not work, there are many new medications your caregiver can prescribe. Stronger medications may be used if initial measures are ineffective. Desensitizing injections can be used if medications and avoidance fails. Desensitization is when a patient is given ongoing shots until the body becomes less sensitive to the allergen. Make sure you follow up with your caregiver if problems continue. SEEK MEDICAL CARE IF:   You develop fever (more than 100.5 F (38.1 C).  You develop a cough that does not stop easily (persistent).  You have shortness of breath.  You start wheezing.  Symptoms interfere with normal daily activities. Document Released: 08/17/2001 Document Revised: 02/14/2012 Document Reviewed: 02/26/2009 [facility] Patient Information 2013 [place], [place].

## 2012-11-21 ENCOUNTER — Other Ambulatory Visit: Payer: Self-pay | Admitting: *Deleted

## 2012-11-21 MED ORDER — LEVONORGESTREL-ETHINYL ESTRAD 0.15-30 MG-MCG PO TABS: 1.0000 | ORAL_TABLET | Freq: Every day | ORAL | Status: DC

## 2012-11-21 NOTE — Telephone Encounter (Signed)
Sent in Nordette. Generic

## 2012-11-21 NOTE — Telephone Encounter (Signed)
Please advise 

## 2012-11-21 NOTE — Telephone Encounter (Signed)
Pt states she needs a less expensive birth control pill. Pharm states it was more due to the "low dose". But pt states low dose not required. Pt said that w/ insurance it was $88.00. Pls advise.

## 2012-11-21 NOTE — Telephone Encounter (Signed)
Left message to advise pt Rx sent in 

## 2012-12-05 ENCOUNTER — Encounter (HOSPITAL_COMMUNITY): Payer: Self-pay | Admitting: *Deleted

## 2012-12-05 ENCOUNTER — Emergency Department (INDEPENDENT_AMBULATORY_CARE_PROVIDER_SITE_OTHER)
Admission: EM | Admit: 2012-12-05 | Discharge: 2012-12-05 | Disposition: A | Discharge status: Home / Self Care | Payer: BC Managed Care – PPO | Source: Home / Self Care | Attending: Family Medicine | Admitting: Family Medicine

## 2012-12-05 DIAGNOSIS — J111 Influenza due to unidentified influenza virus with other respiratory manifestations: Secondary | ICD-10-CM

## 2012-12-05 NOTE — ED Provider Notes (Signed)
History     CSN: [id number]  Arrival date & time 12/05/12  1816   First MD Initiated Contact with Patient 12/05/12 1846      Chief Complaint  Patient presents with  . Chills    (Consider location/radiation/quality/duration/timing/severity/associated sxs/prior treatment) Patient is a 21 y.o. female presenting with fever. The history is provided by the patient and a parent.  Fever Primary symptoms of the febrile illness include fever, cough, nausea and myalgias. Primary symptoms do not include shortness of breath, vomiting or diarrhea. The current episode started yesterday. This is a new problem. The problem has not changed since onset.   Past Medical History  Diagnosis Date  . Bacterial vaginosis   . Chlamydia     History reviewed. No pertinent past surgical history.  Family History  Problem Relation Age of Onset  . Arthritis Maternal Grandmother   . Heart disease Maternal Grandmother   . Stroke Maternal Grandmother   . Hypertension Maternal Grandmother     History  Substance Use Topics  . Smoking status: Former Smoker    Quit date: 10/28/2011  . Smokeless tobacco: Not on file     Comment: pt notes that she quit about 1 month ago  . Alcohol Use: Yes     Comment: twice a month    OB History    Grav Para Term Preterm Abortions TAB SAB Ect Mult Living                  Review of Systems  Constitutional: Positive for fever, chills and appetite change.  HENT: Positive for congestion.   Respiratory: Positive for cough. Negative for shortness of breath.   Gastrointestinal: Positive for nausea. Negative for vomiting and diarrhea.  Musculoskeletal: Positive for myalgias.  Skin: Negative.     Allergies  Review of patient's allergies indicates no known allergies.  Home Medications   Current Outpatient Rx  Name  Route  Sig  Dispense  Refill  . FLUCONAZOLE 150 MG PO TABS      1 tab po now repeat in 72h   2 tablet   0   . FLUTICASONE PROPIONATE 50 MCG/ACT NA  SUSP   Nasal   Place 2 sprays into the nose daily.   16 g   6   . LEVONORGESTREL-ETHINYL ESTRAD 0.15-30 MG-MCG PO TABS   Oral   Take 1 tablet by mouth daily.   1 Package   11   . NORETHIN-ETH ESTRAD-FE BIPHAS 1 MG-10 MCG / 10 MCG PO TABS   Oral   Take 1 tablet by mouth daily.   1 Package   11   . PERMETHRIN 5 % EX CREA   Topical   Apply topically once.   60 g   1   . TRIAMCINOLONE 0.1 % CREAM:EUCERIN CREAM 1:1   Topical   Apply 1 application topically 2 (two) times daily.   1 each   1     BP 110/72  Pulse 72  Temp 98.6 F (37 C) (Oral)  Resp 18  SpO2 100%  LMP 12/03/2012  Physical Exam  Nursing note and vitals reviewed. Constitutional: She is oriented to person, place, and time. She appears well-developed and well-nourished.  HENT:  Head: Normocephalic.  Right Ear: External ear normal.  Left Ear: External ear normal.  Mouth/Throat: Oropharynx is clear and moist.  Eyes: Pupils are equal, round, and reactive to light.  Neck: Normal range of motion. Neck supple.  Cardiovascular: Normal rate, regular rhythm, normal heart  sounds and intact distal pulses.   Pulmonary/Chest: Effort normal and breath sounds normal.  Abdominal: Soft. Bowel sounds are normal.  Neurological: She is alert and oriented to person, place, and time.  Skin: Skin is warm and dry.    ED Course  Procedures (including critical care time)  Labs Reviewed - No data to display No results found.   1. Influenza-like illness       MDM          Linna Hoff, MD 12/05/12 [phone]

## 2012-12-05 NOTE — ED Notes (Signed)
Pt  Reports  Symptoms  Of  Chills  Body  Aches   Weakness   As  Well  As         sorethroat        -  Symptoms  Began last  Pm    She   Reports  She  Had  A  sorethroat  Yesterday        She  Ambulated  To  Exam  Room   Appears  In no  Severe  Distress  She  Was  Masked  And  Placed  In a  Private  Room

## 2013-03-08 ENCOUNTER — Ambulatory Visit (INDEPENDENT_AMBULATORY_CARE_PROVIDER_SITE_OTHER): Payer: BC Managed Care – PPO | Admitting: Family Medicine

## 2013-03-08 ENCOUNTER — Encounter: Payer: Self-pay | Admitting: Family Medicine

## 2013-03-08 VITALS — BP 122/80 | HR 63 | Temp 99.6°F | Wt 119.0 lb

## 2013-03-08 DIAGNOSIS — N390 Urinary tract infection, site not specified: Secondary | ICD-10-CM

## 2013-03-08 LAB — POCT URINALYSIS DIPSTICK
Bilirubin, UA: NEGATIVE
Glucose, UA: NEGATIVE
Ketones, UA: NEGATIVE
Spec Grav, UA: 1.02

## 2013-03-08 MED ORDER — CIPROFLOXACIN HCL 500 MG PO TABS: 500.0000 mg | ORAL_TABLET | Freq: Two times a day (BID) | ORAL | Status: DC

## 2013-03-08 NOTE — Patient Instructions (Addendum)
Urinary Tract Infection  A urinary tract infection (UTI) is often caused by a germ (bacteria). A UTI is usually helped with medicine (antibiotics) that kills germs. Take all the medicine until it is gone. Do this even if you are feeling better. You are usually better in 7 to 10 days.  HOME CARE    Drink enough water and fluids to keep your pee (urine) clear or pale yellow. Drink:   Cranberry juice.   Water.   Avoid:   Caffeine.   Tea.   Bubbly (carbonated) drinks.   Alcohol.   Only take medicine as told by your doctor.   To prevent further infections:   Pee often.   After pooping (bowel movement), women should wipe from front to back. Use each tissue only once.   Pee before and after having sex (intercourse).  Ask your doctor when your test results will be ready. Make sure you follow up and get your test results.   GET HELP RIGHT AWAY IF:    There is very bad back pain or lower belly (abdominal) pain.   You get the chills.   You have a fever.   Your baby is older than 3 months with a rectal temperature of 102 F (38.9 C) or higher.   Your baby is 3 months old or younger with a rectal temperature of 100.4 F (38 C) or higher.   You feel sick to your stomach (nauseous) or throw up (vomit).   There is continued burning with peeing.   Your problems are not better in 3 days. Return sooner if you are getting worse.  MAKE SURE YOU:    Understand these instructions.   Will watch your condition.   Will get help right away if you are not doing well or get worse.  Document Released: 05/10/2008 Document Revised: 02/14/2012 Document Reviewed: 05/10/2008  [employer] Patient Information 2013 [employer].

## 2013-03-08 NOTE — Progress Notes (Signed)
Chief Complaint  Patient presents with  . Urinary Tract Infection    frequency, burning, low back pain/cramp     HPI:  ? UTI: -started about 4 days ago -symptoms: urinary frequency and urgency, dysuria - burning when urinates, saw a streak of blood a few days ago in urine, R sided flank pain at times yesterday and today a little -denies: fevers, nausea,vomiting, vaginal symptoms, diarrhea -FDLMP: 02/23/13 -reports had UTI a few years ago and felt just like this -no allergies to abx -not pregnant  ROS: See pertinent positives and negatives per HPI.  Past Medical History  Diagnosis Date  . Bacterial vaginosis   . Chlamydia     Family History  Problem Relation Age of Onset  . Arthritis Maternal Grandmother   . Heart disease Maternal Grandmother   . Stroke Maternal Grandmother   . Hypertension Maternal Grandmother     History   Social History  . Marital Status: Single    Spouse Name: N/A    Number of Children: N/A  . Years of Education: N/A   Social History Main Topics  . Smoking status: Former Smoker    Quit date: 10/28/2011  . Smokeless tobacco: None     Comment: pt notes that she quit about 1 month ago  . Alcohol Use: Yes     Comment: twice a month  . Drug Use: No  . Sexually Active: Yes     Comment: no sxual contact since April 2013   Other Topics Concern  . None   Social History Narrative  . None    Current outpatient prescriptions:fluticasone (FLONASE) 50 MCG/ACT nasal spray, Place 2 sprays into the nose daily., Disp: 16 g, Rfl: 6;  Norethindrone-Ethinyl Estradiol-Fe Biphas (LO LOESTRIN FE) 1 MG-10 MCG / 10 MCG tablet, Take 1 tablet by mouth daily., Disp: 1 Package, Rfl: 11;  permethrin (ELIMITE) 5 % cream, Apply topically once., Disp: 60 g, Rfl: 1 Triamcinolone Acetonide (TRIAMCINOLONE 0.1 % CREAM : EUCERIN) CREA, Apply 1 application topically 2 (two) times daily., Disp: 1 each, Rfl: 1;  ciprofloxacin (CIPRO) 500 MG tablet, Take 1 tablet (500 mg total) by  mouth 2 (two) times daily., Disp: 14 tablet, Rfl: 0;  fluconazole (DIFLUCAN) 150 MG tablet, 1 tab po now repeat in 72h, Disp: 2 tablet, Rfl: 0 levonorgestrel-ethinyl estradiol (NORDETTE) 0.15-30 MG-MCG tablet, Take 1 tablet by mouth daily., Disp: 1 Package, Rfl: 11;  [DISCONTINUED] medroxyPROGESTERone (DEPO-PROVERA) 150 MG/ML injection, Inject 150 mg into the muscle every 3 (three) months. , Disp: , Rfl:   EXAM:  Filed Vitals:   03/08/13 1319  BP: 122/80  Pulse: 63  Temp: 99.6 F (37.6 C)    Body mass index is 20.12 kg/(m^2).  GENERAL: vitals reviewed and listed above, alert, oriented, appears well hydrated and in no acute distress  HEENT: atraumatic, conjunttiva clear, no obvious abnormalities on inspection of external nose and ears  NECK: no obvious masses on inspection  LUNGS: clear to auscultation bilaterally, no wheezes, rales or rhonchi, good air movement  CV: HRRR, no peripheral edema  ABD: soft, NTTP, no CVA TTP  MS: moves all extremities without noticeable abnormality  PSYCH: pleasant and cooperative, no obvious depression or anxiety  ASSESSMENT AND PLAN:  Discussed the following assessment and plan:  UTI (urinary tract infection) - Plan: Culture, Urine, Urine Microscopic Only, POCT urinalysis dipstick, ciprofloxacin (CIPRO) 500 MG tablet  -looks well on exam, smx and udip c/w likely uncomplicated UTI with ? Mild pyelo or nephrolithiasis? Discussed these  possibilities and workup options and offered CT scan. She reports she feels well and will hold of on this for now and tx with cipro and oral hydration. Pt is to see a doctor immediately if worsening or not improving over next several days and will follow up with PCP in 1 month to ensure blood gone. -Patient advised to return or notify a doctor immediately if symptoms worsen or persist or new concerns arise.  Patient Instructions  Urinary Tract Infection A urinary tract infection (UTI) is often caused by a germ  (bacteria). A UTI is usually helped with medicine (antibiotics) that kills germs. Take all the medicine until it is gone. Do this even if you are feeling better. You are usually better in 7 to 10 days. HOME CARE   Drink enough water and fluids to keep your pee (urine) clear or pale yellow. Drink:  Cranberry juice.  Water.  Avoid:  Caffeine.  Tea.  Bubbly (carbonated) drinks.  Alcohol.  Only take medicine as told by your doctor.  To prevent further infections:  Pee often.  After pooping (bowel movement), women should wipe from front to back. Use each tissue only once.  Pee before and after having sex (intercourse). Ask your doctor when your test results will be ready. Make sure you follow up and get your test results.  GET HELP RIGHT AWAY IF:   There is very bad back pain or lower belly (abdominal) pain.  You get the chills.  You have a fever.  Your baby is older than 3 months with a rectal temperature of 102 F (38.9 C) or higher.  Your baby is 26 months old or younger with a rectal temperature of 100.4 F (38 C) or higher.  You feel sick to your stomach (nauseous) or throw up (vomit).  There is continued burning with peeing.  Your problems are not better in 3 days. Return sooner if you are getting worse. MAKE SURE YOU:   Understand these instructions.  Will watch your condition.  Will get help right away if you are not doing well or get worse. Document Released: 05/10/2008 Document Revised: 02/14/2012 Document Reviewed: 05/10/2008 [facility] Patient Information 2013 [place], [place], [place] R.

## 2013-03-09 LAB — URINALYSIS, MICROSCOPIC ONLY: Squamous Epithelial / LPF: NONE SEEN

## 2013-03-11 LAB — URINE CULTURE: Colony Count: >=100000

## 2013-04-10 ENCOUNTER — Ambulatory Visit (INDEPENDENT_AMBULATORY_CARE_PROVIDER_SITE_OTHER): Payer: BC Managed Care – PPO | Admitting: Family

## 2013-04-10 ENCOUNTER — Encounter: Payer: Self-pay | Admitting: Family

## 2013-04-10 VITALS — BP 100/60 | HR 83 | Wt 118.0 lb

## 2013-04-10 DIAGNOSIS — Z309 Encounter for contraceptive management, unspecified: Secondary | ICD-10-CM

## 2013-04-10 LAB — POCT URINE PREGNANCY: Preg Test, Ur: NEGATIVE

## 2013-04-10 MED ORDER — MEDROXYPROGESTERONE ACETATE 150 MG/ML IM SUSP
150.0000 mg | Freq: Once | INTRAMUSCULAR | Status: AC
  Administered 2013-04-10: 150 mg via INTRAMUSCULAR

## 2013-04-10 NOTE — Progress Notes (Signed)
Subjective:    Patient ID: Grace [name], female    DOB: 1991-08-09, 21 y.o.   MRN: [id number]  HPI 22 year old [place] American female, nonsmoker is in to discuss birth control management. She's interested in Norplant. However, she was like to get Depo-Provera today. She's been on Depo-Provera in the past and tolerated it well. She's currently on her menstrual cycle.   Review of Systems  Constitutional: Negative.   Respiratory: Negative.   Cardiovascular: Negative.   Genitourinary: Negative.   Psychiatric/Behavioral: Negative.    Past Medical History  Diagnosis Date  . Bacterial vaginosis   . Chlamydia     History   Social History  . Marital Status: Single    Spouse Name: N/A    Number of Children: N/A  . Years of Education: N/A   Occupational History  . Not on file.   Social History Main Topics  . Smoking status: Former Smoker    Quit date: 10/28/2011  . Smokeless tobacco: Not on file     Comment: pt notes that she quit about 1 month ago  . Alcohol Use: Yes     Comment: twice a month  . Drug Use: No  . Sexually Active: Yes     Comment: no sxual contact since April 2013   Other Topics Concern  . Not on file   Social History Narrative  . No narrative on file    History reviewed. No pertinent past surgical history.  Family History  Problem Relation Age of Onset  . Arthritis Maternal Grandmother   . Heart disease Maternal Grandmother   . Stroke Maternal Grandmother   . Hypertension Maternal Grandmother     No Known Allergies  Current Outpatient Prescriptions on File Prior to Visit  Medication Sig Dispense Refill  . fluticasone (FLONASE) 50 MCG/ACT nasal spray Place 2 sprays into the nose daily.  16 g  6  . ciprofloxacin (CIPRO) 500 MG tablet Take 1 tablet (500 mg total) by mouth 2 (two) times daily.  14 tablet  0  . fluconazole (DIFLUCAN) 150 MG tablet 1 tab po now repeat in 72h  2 tablet  0  . levonorgestrel-ethinyl estradiol (NORDETTE) 0.15-30  MG-MCG tablet Take 1 tablet by mouth daily.  1 Package  11  . Norethindrone-Ethinyl Estradiol-Fe Biphas (LO LOESTRIN FE) 1 MG-10 MCG / 10 MCG tablet Take 1 tablet by mouth daily.  1 Package  11  . permethrin (ELIMITE) 5 % cream Apply topically once.  60 g  1  . Triamcinolone Acetonide (TRIAMCINOLONE 0.1 % CREAM : EUCERIN) CREA Apply 1 application topically 2 (two) times daily.  1 each  1  . [DISCONTINUED] medroxyPROGESTERone (DEPO-PROVERA) 150 MG/ML injection Inject 150 mg into the muscle every 3 (three) months.        No current facility-administered medications on file prior to visit.    BP 100/60  Pulse 83  Wt 118 lb (53.524 kg)  BMI 19.95 kg/m2  SpO2 100%  LMP 05/03/2014chart    Objective:   Physical Exam  Constitutional: She appears well-developed and well-nourished.  Cardiovascular: Normal rate, regular rhythm and normal heart sounds.   Pulmonary/Chest: Effort normal and breath sounds normal.  Skin: Skin is warm and dry.  Psychiatric: She has a normal mood and affect.    Urine preg: negative      Assessment & Plan:  Assessment: 1. Counseling birth control  Plan: Depo-Provera 150 mg administered IM x1. Consider Norplant. Call the office and let me know what you  decide. Recheck a schedule, and as needed.

## 2013-04-10 NOTE — Patient Instructions (Addendum)

## 2013-05-10 ENCOUNTER — Telehealth: Payer: Self-pay | Admitting: Family

## 2013-05-10 NOTE — Telephone Encounter (Signed)
Pt states she started depo provera 3 weeks ago and has had continual spotting since.  Pt inquiring if she should also start pill with higher dosage of estrogen to relieve spotting.  Appt scheduled for Monday, 6/9.  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

## 2013-05-10 NOTE — Telephone Encounter (Signed)
Attempted to call pt to no avail.   Depo-provera can cause spotting for up to 3 months after initial dose. Pt does not need to start a pill and does not need an appointment. I will try to call her again

## 2013-05-11 NOTE — Telephone Encounter (Signed)
Pt aware of side effects of depo-provera. Advised pt to stay as dry as possible while having to wear panty liners and to not use scented panty liners. Pt verbalized understanding and would like appointment canceled.  Appointment canceled

## 2013-05-14 ENCOUNTER — Ambulatory Visit: Payer: BC Managed Care – PPO | Admitting: Family

## 2013-07-17 ENCOUNTER — Ambulatory Visit: Payer: BC Managed Care – PPO | Admitting: Nurse Practitioner

## 2013-07-19 ENCOUNTER — Encounter: Payer: Self-pay | Admitting: Family Medicine

## 2013-07-19 ENCOUNTER — Other Ambulatory Visit (HOSPITAL_COMMUNITY)
Admission: RE | Admit: 2013-07-19 | Discharge: 2013-07-19 | Disposition: A | Discharge status: Home / Self Care | Payer: BC Managed Care – PPO | Source: Ambulatory Visit | Attending: Family Medicine | Admitting: Family Medicine

## 2013-07-19 ENCOUNTER — Ambulatory Visit (INDEPENDENT_AMBULATORY_CARE_PROVIDER_SITE_OTHER): Payer: BC Managed Care – PPO | Admitting: Family Medicine

## 2013-07-19 VITALS — BP 100/70 | Temp 99.3°F | Wt 118.0 lb

## 2013-07-19 DIAGNOSIS — T148 Other injury of unspecified body region: Secondary | ICD-10-CM

## 2013-07-19 DIAGNOSIS — W57XXXA Bitten or stung by nonvenomous insect and other nonvenomous arthropods, initial encounter: Secondary | ICD-10-CM

## 2013-07-19 DIAGNOSIS — Z113 Encounter for screening for infections with a predominantly sexual mode of transmission: Secondary | ICD-10-CM | POA: Insufficient documentation

## 2013-07-19 DIAGNOSIS — N760 Acute vaginitis: Secondary | ICD-10-CM | POA: Insufficient documentation

## 2013-07-19 DIAGNOSIS — N898 Other specified noninflammatory disorders of vagina: Secondary | ICD-10-CM

## 2013-07-19 NOTE — Progress Notes (Signed)
Chief Complaint  Patient presents with  . bacterial infection    HPI:  Acute visit for:  1) vaginal discharge: -started: started 3-4 days ago -symptoms: increased vaginal dicharge -denies: abd or pelvic pain, dysuria, fevers, NVD, new sexual partners, concern for STIs -fdlmp: started yesterday -hx of: BV and chlamydia  2) bumps on L hand and Leg: -few weeks on and off, different lesions, now getting better -itchy   ROS: See pertinent positives and negatives per HPI.  Past Medical History  Diagnosis Date  . Bacterial vaginosis   . Chlamydia     Family History  Problem Relation Age of Onset  . Arthritis Maternal Grandmother   . Heart disease Maternal Grandmother   . Stroke Maternal Grandmother   . Hypertension Maternal Grandmother     History   Social History  . Marital Status: Single    Spouse Name: N/A    Number of Children: N/A  . Years of Education: N/A   Social History Main Topics  . Smoking status: Former Smoker    Quit date: 10/28/2011  . Smokeless tobacco: None     Comment: pt notes that she quit about 1 month ago  . Alcohol Use: Yes     Comment: twice a month  . Drug Use: No  . Sexual Activity: Yes     Comment: no sxual contact since April 2013   Other Topics Concern  . None   Social History Narrative  . None    Current outpatient prescriptions:fluticasone (FLONASE) 50 MCG/ACT nasal spray, Place 2 sprays into the nose daily., Disp: 16 g, Rfl: 6;  Triamcinolone Acetonide (TRIAMCINOLONE 0.1 % CREAM : EUCERIN) CREA, Apply 1 application topically 2 (two) times daily., Disp: 1 each, Rfl: 1;  ciprofloxacin (CIPRO) 500 MG tablet, Take 1 tablet (500 mg total) by mouth 2 (two) times daily., Disp: 14 tablet, Rfl: 0 fluconazole (DIFLUCAN) 150 MG tablet, 1 tab po now repeat in 72h, Disp: 2 tablet, Rfl: 0;  levonorgestrel-ethinyl estradiol (NORDETTE) 0.15-30 MG-MCG tablet, Take 1 tablet by mouth daily., Disp: 1 Package, Rfl: 11;  Norethindrone-Ethinyl  Estradiol-Fe Biphas (LO LOESTRIN FE) 1 MG-10 MCG / 10 MCG tablet, Take 1 tablet by mouth daily., Disp: 1 Package, Rfl: 11 ORTHO TRI-CYCLEN LO 0.18/0.215/0.25 MG-25 MCG tab, Take 1 tablet by mouth daily. , Disp: , Rfl: ;  permethrin (ELIMITE) 5 % cream, Apply topically once., Disp: 60 g, Rfl: 1;  [DISCONTINUED] medroxyPROGESTERone (DEPO-PROVERA) 150 MG/ML injection, Inject 150 mg into the muscle every 3 (three) months. , Disp: , Rfl:   EXAM:  Filed Vitals:   07/19/13 1524  BP: 100/70  Temp: 99.3 F (37.4 C)    Body mass index is 19.95 kg/(m^2).  GENERAL: vitals reviewed and listed above, alert, oriented, appears well hydrated and in no acute distress  HEENT: atraumatic, conjunttiva clear, no obvious abnormalities on inspection of external nose and ears  NECK: no obvious masses on inspection  GU: normal except for cervical bleeding, no CMT  SKIN: 3 papules on R wrist  MS: moves all extremities without noticeable abnormality  PSYCH: pleasant and cooperative, no obvious depression or anxiety  ASSESSMENT AND PLAN:  Discussed the following assessment and plan:  Vaginal discharge - Plan: Cervicovaginal ancillary only -GC/Chlam, wet prep - will contact her with results  Insect bites -looks like insect bites, she just moved and now they are resolving, advised follow up with PCP if persist and checking for fleas and bed bugs but may have been at previous  residence -Patient advised to return or notify a doctor immediately if symptoms worsen or persist or new concerns arise.  There are no Patient Instructions on file for this visit.   [name]

## 2013-07-20 MED ORDER — METRONIDAZOLE 500 MG PO TABS: 500.0000 mg | ORAL_TABLET | Freq: Two times a day (BID) | ORAL | Status: DC

## 2013-07-20 NOTE — Progress Notes (Signed)
Quick Note:  I spoke with pt and sent script e-scribe to [facility]. ______

## 2013-07-20 NOTE — Addendum Note (Signed)
Addended by: Aniceto Boss A on: 07/20/2013 03:31 PM   Modules accepted: Orders

## 2013-09-26 ENCOUNTER — Ambulatory Visit: Payer: BC Managed Care – PPO | Admitting: Family

## 2013-09-26 DIAGNOSIS — Z0289 Encounter for other administrative examinations: Secondary | ICD-10-CM

## 2013-10-22 ENCOUNTER — Encounter: Payer: Self-pay | Admitting: Family

## 2013-10-22 ENCOUNTER — Ambulatory Visit (INDEPENDENT_AMBULATORY_CARE_PROVIDER_SITE_OTHER): Payer: BC Managed Care – PPO | Admitting: Family

## 2013-10-22 ENCOUNTER — Other Ambulatory Visit (HOSPITAL_COMMUNITY)
Admission: RE | Admit: 2013-10-22 | Discharge: 2013-10-22 | Disposition: A | Discharge status: Home / Self Care | Payer: BC Managed Care – PPO | Source: Ambulatory Visit | Attending: Family | Admitting: Family

## 2013-10-22 VITALS — BP 100/64 | HR 84 | Wt 122.0 lb

## 2013-10-22 DIAGNOSIS — Z113 Encounter for screening for infections with a predominantly sexual mode of transmission: Secondary | ICD-10-CM | POA: Insufficient documentation

## 2013-10-22 DIAGNOSIS — N760 Acute vaginitis: Secondary | ICD-10-CM | POA: Insufficient documentation

## 2013-10-22 DIAGNOSIS — Z3009 Encounter for other general counseling and advice on contraception: Secondary | ICD-10-CM

## 2013-10-22 DIAGNOSIS — Z01419 Encounter for gynecological examination (general) (routine) without abnormal findings: Secondary | ICD-10-CM | POA: Insufficient documentation

## 2013-10-22 DIAGNOSIS — Z124 Encounter for screening for malignant neoplasm of cervix: Secondary | ICD-10-CM

## 2013-10-22 LAB — POCT URINE PREGNANCY: Preg Test, Ur: NEGATIVE

## 2013-10-22 MED ORDER — LEVONORGEST-ETH ESTRAD 91-DAY 0.15-0.03 &0.01 MG PO TABS: 1.0000 | ORAL_TABLET | Freq: Every day | ORAL | Status: DC

## 2013-10-22 NOTE — Progress Notes (Signed)
Subjective:    Patient ID: Grace [name], female    DOB: Oct 08, 1991, 22 y.o.   MRN: [id number]  HPI 22 year old [place] American female, nonsmoker is in for Pap and pelvic today and to discuss birth control. She is sexually active denies any concerns or any sexually-transmitted diseases. However, is reported to more irritable than usual around her menstrual cycle.. Denies any feeling of helplessness, hopelessness, thoughts of death or dying.   Review of Systems  Constitutional: Negative.   HENT: Negative.   Respiratory: Negative.   Cardiovascular: Negative.   Endocrine: Negative.   Genitourinary: Negative.   Musculoskeletal: Negative.   Skin: Negative.   Psychiatric/Behavioral: Negative.    Past Medical History  Diagnosis Date  . Bacterial vaginosis   . Chlamydia     History   Social History  . Marital Status: Single    Spouse Name: N/A    Number of Children: N/A  . Years of Education: N/A   Occupational History  . Not on file.   Social History Main Topics  . Smoking status: Former Smoker    Quit date: 10/28/2011  . Smokeless tobacco: Not on file     Comment: pt notes that she quit about 1 month ago  . Alcohol Use: Yes     Comment: twice a month  . Drug Use: No  . Sexual Activity: Yes     Comment: no sxual contact since April 2013   Other Topics Concern  . Not on file   Social History Narrative  . No narrative on file    History reviewed. No pertinent past surgical history.  Family History  Problem Relation Age of Onset  . Arthritis Maternal Grandmother   . Heart disease Maternal Grandmother   . Stroke Maternal Grandmother   . Hypertension Maternal Grandmother     No Known Allergies  Current Outpatient Prescriptions on File Prior to Visit  Medication Sig Dispense Refill  . ciprofloxacin (CIPRO) 500 MG tablet Take 1 tablet (500 mg total) by mouth 2 (two) times daily.  14 tablet  0  . fluconazole (DIFLUCAN) 150 MG tablet 1 tab po now repeat in 72h   2 tablet  0  . fluticasone (FLONASE) 50 MCG/ACT nasal spray Place 2 sprays into the nose daily.  16 g  6  . levonorgestrel-ethinyl estradiol (NORDETTE) 0.15-30 MG-MCG tablet Take 1 tablet by mouth daily.  1 Package  11  . metroNIDAZOLE (FLAGYL) 500 MG tablet Take 1 tablet (500 mg total) by mouth 2 (two) times daily.  14 tablet  0  . Norethindrone-Ethinyl Estradiol-Fe Biphas (LO LOESTRIN FE) 1 MG-10 MCG / 10 MCG tablet Take 1 tablet by mouth daily.  1 Package  11  . ORTHO TRI-CYCLEN LO 0.18/0.215/0.25 MG-25 MCG tab Take 1 tablet by mouth daily.       . permethrin (ELIMITE) 5 % cream Apply topically once.  60 g  1  . Triamcinolone Acetonide (TRIAMCINOLONE 0.1 % CREAM : EUCERIN) CREA Apply 1 application topically 2 (two) times daily.  1 each  1  . [DISCONTINUED] medroxyPROGESTERone (DEPO-PROVERA) 150 MG/ML injection Inject 150 mg into the muscle every 3 (three) months.        No current facility-administered medications on file prior to visit.    BP 100/64  Pulse 84  Wt 122 lb (55.339 kg)  LMP 11/12/2014chart    Objective:   Physical Exam  Constitutional: She is oriented to person, place, and time. She appears well-developed and well-nourished.  Cardiovascular: Normal  rate, regular rhythm and normal heart sounds.   Pulmonary/Chest: Effort normal and breath sounds normal.  Genitourinary: Vaginal discharge found.  Bloody discharge consistent with her menstrual cycle. However, malodorous  Neurological: She is alert and oriented to person, place, and time.  Skin: Skin is warm and dry.  Psychiatric: She has a normal mood and affect.          Assessment & Plan:  Assessment: 1. Calcium birth control 2. Pap smear  Plan: Pap smear [name]. When the patient and the results. Prescription for birth control faxed to the pharmacy. Patient to start on Sunday. Cardiotomy questions or concerns. Check as scheduled, and as needed.

## 2013-10-22 NOTE — Patient Instructions (Signed)
Safe Sex Safe sex is about reducing the risk of giving or getting a sexually transmitted disease (STD). STDs are spread through sexual contact involving the genitals, mouth, or rectum. Some STDS can be cured and others cannot. Safe sex can also prevent unintended pregnancies.  SAFE SEX PRACTICES  Limit your sexual activity to only one partner who is only having sex with you.  Talk to your partner about their past partners, past STDs, and drug use.  Use a condom every time you have sexual intercourse. This includes vaginal, oral, and anal sexual activity. Both females and males should wear condoms during oral sex. Only use latex or polyurethane condoms and water-based lubricants. Petroleum-based lubricants or oils used to lubricate a condom will weaken the condom and increase the chance that it will break. The condom should be in place from the beginning to the end of sexual activity. Wearing a condom reduces, but does not completely eliminate, your risk of getting or giving a STD. STDs can be spread by contact with skin of surrounding areas.  Get vaccinated for hepatitis B and HPV.  Avoid alcohol and recreational drugs which can affect your judgement. You may forget to use a condom or participate in high-risk sex.  For females, avoid douching after sexual intercourse. Douching can spread an infection farther into the reproductive tract.  Check your body for signs of sores, blisters, rashes, or unusual discharge. See your caregiver if you notice any of these signs.  Avoid sexual contact if you have symptoms of an infection or are being treated for an STD. If you or your partner has herpes, avoid sexual contact when blisters are present. Use condoms at all other times.  See your caregiver for regular screenings, examinations, and tests for STDs. Before having sex with a new partner, each of you should be screened for STDs and talk about the results with your partner. BENEFITS OF SAFE SEX   There  is less of a chance of getting or giving an STD.  You can prevent unwanted or unintended pregnancies.  By discussing safer sex concerns with your partner, you may increase feelings of intimacy, comfort, trust, and honesty between the both of you. Document Released: 12/30/2004 Document Revised: 08/16/2012 Document Reviewed: 05/15/2012 [employer] Patient Information 2014 [employer]  

## 2013-10-26 ENCOUNTER — Other Ambulatory Visit: Payer: Self-pay | Admitting: Family

## 2013-10-26 MED ORDER — FLUCONAZOLE 150 MG PO TABS: ORAL_TABLET | ORAL | Status: DC

## 2013-10-26 MED ORDER — METRONIDAZOLE 500 MG PO TABS: 500.0000 mg | ORAL_TABLET | Freq: Two times a day (BID) | ORAL | Status: DC

## 2013-11-07 ENCOUNTER — Telehealth: Payer: Self-pay | Admitting: Family

## 2013-11-07 MED ORDER — FLUCONAZOLE 150 MG PO TABS: ORAL_TABLET | ORAL | Status: DC

## 2013-11-07 NOTE — Telephone Encounter (Signed)
Pt would like a refill metroNIDAZOLE (FLAGYL) 500 MG tablet Pt has another yeast inf. Pharm: [facility]

## 2013-11-07 NOTE — Telephone Encounter (Signed)
Diflucan sent to pharmacy.

## 2013-12-13 ENCOUNTER — Telehealth: Payer: Self-pay | Admitting: Family

## 2013-12-13 MED ORDER — FLUCONAZOLE 150 MG PO TABS: ORAL_TABLET | ORAL | Status: DC

## 2013-12-13 NOTE — Telephone Encounter (Signed)
Pt has another yeast inf and would like diflucan call into [facility] [address]

## 2014-01-10 ENCOUNTER — Telehealth: Payer: Self-pay | Admitting: Family

## 2014-01-10 NOTE — Telephone Encounter (Signed)
Pt request refill fluconazole (DIFLUCAN) 150 MG tablet  #2  [facility]

## 2014-01-11 MED ORDER — FLUCONAZOLE 150 MG PO TABS
ORAL_TABLET | ORAL | Status: DC
Start: 2014-01-11 — End: 2014-04-19

## 2014-01-11 NOTE — Telephone Encounter (Signed)
Pt aware and verbalized understanding.  

## 2014-01-11 NOTE — Telephone Encounter (Signed)
This time only

## 2014-04-18 ENCOUNTER — Telehealth: Payer: Self-pay | Admitting: Family

## 2014-04-18 NOTE — Telephone Encounter (Signed)
Pt called and ask if she can have an rx on fluconazole (DIFLUCAN) 150 MG tablet

## 2014-04-19 MED ORDER — FLUCONAZOLE 150 MG PO TABS: ORAL_TABLET | ORAL | Status: DC

## 2014-04-19 NOTE — Telephone Encounter (Signed)
Ok this time, per [place]. Will need OV for further refills

## 2015-01-18 ENCOUNTER — Other Ambulatory Visit: Payer: Self-pay | Admitting: Family

## 2015-02-05 ENCOUNTER — Encounter: Payer: Self-pay | Admitting: Family

## 2015-02-05 ENCOUNTER — Ambulatory Visit (INDEPENDENT_AMBULATORY_CARE_PROVIDER_SITE_OTHER): Payer: BLUE CROSS/BLUE SHIELD | Admitting: Family

## 2015-02-05 ENCOUNTER — Ambulatory Visit: Payer: Self-pay | Admitting: Family

## 2015-02-05 ENCOUNTER — Other Ambulatory Visit (HOSPITAL_COMMUNITY)
Admission: RE | Admit: 2015-02-05 | Discharge: 2015-02-05 | Disposition: A | Discharge status: Home / Self Care | Payer: BLUE CROSS/BLUE SHIELD | Source: Ambulatory Visit | Attending: Family | Admitting: Family

## 2015-02-05 VITALS — BP 100/60 | HR 87 | Temp 98.8°F | Ht 65.0 in | Wt 121.0 lb

## 2015-02-05 DIAGNOSIS — Z01411 Encounter for gynecological examination (general) (routine) with abnormal findings: Secondary | ICD-10-CM | POA: Diagnosis present

## 2015-02-05 DIAGNOSIS — Z1151 Encounter for screening for human papillomavirus (HPV): Secondary | ICD-10-CM | POA: Insufficient documentation

## 2015-02-05 DIAGNOSIS — R87810 Cervical high risk human papillomavirus (HPV) DNA test positive: Secondary | ICD-10-CM | POA: Diagnosis not present

## 2015-02-05 DIAGNOSIS — N760 Acute vaginitis: Secondary | ICD-10-CM | POA: Insufficient documentation

## 2015-02-05 DIAGNOSIS — Z124 Encounter for screening for malignant neoplasm of cervix: Secondary | ICD-10-CM

## 2015-02-05 DIAGNOSIS — Z113 Encounter for screening for infections with a predominantly sexual mode of transmission: Secondary | ICD-10-CM | POA: Insufficient documentation

## 2015-02-05 MED ORDER — NORGESTIM-ETH ESTRAD TRIPHASIC 0.18/0.215/0.25 MG-25 MCG PO TABS: 1.0000 | ORAL_TABLET | Freq: Every day | ORAL | Status: DC

## 2015-02-05 NOTE — Progress Notes (Signed)
Pre visit review using our clinic review tool, if applicable. No additional management support is needed unless otherwise documented below in the visit note. 

## 2015-02-05 NOTE — Patient Instructions (Signed)

## 2015-02-05 NOTE — Progress Notes (Signed)
Subjective:    Patient ID: Grace [name], female    DOB: [birth year], 24 y.o.   MRN: [id number]  HPI 24 year old African-American female, nonsmoker is in today requesting a refill on birth control, and for Pap smear and pelvic exam. Denies any concerns today. Is sexually active with one female partner, unprotected. Denies any concerns of sexually transmitted diseases.    Review of Systems  Constitutional: Negative.   HENT: Negative.   Respiratory: Negative.   Cardiovascular: Negative.   Gastrointestinal: Negative.   Endocrine: Negative.   Genitourinary: Negative.   Musculoskeletal: Negative.   Skin: Negative.   Allergic/Immunologic: Negative.   Neurological: Negative.   Psychiatric/Behavioral: Negative.    Past Medical History  Diagnosis Date  . Bacterial vaginosis   . Chlamydia     History   Social History  . Marital Status: Single    Spouse Name: N/A  . Number of Children: N/A  . Years of Education: N/A   Occupational History  . Not on file.   Social History Main Topics  . Smoking status: Former Smoker    Quit date: 10/28/2011  . Smokeless tobacco: Not on file     Comment: pt notes that she quit about 1 month ago  . Alcohol Use: Yes     Comment: twice a month  . Drug Use: No  . Sexual Activity: Yes     Comment: no sxual contact since April 2013   Other Topics Concern  . Not on file   Social History Narrative    No past surgical history on file.  Family History  Problem Relation Age of Onset  . Arthritis Maternal Grandmother   . Heart disease Maternal Grandmother   . Stroke Maternal Grandmother   . Hypertension Maternal Grandmother     No Known Allergies  Current Outpatient Prescriptions on File Prior to Visit  Medication Sig Dispense Refill  . Levonorgestrel-Ethinyl Estradiol (AMETHIA) 0.15-0.03 &0.01 MG tablet Take 1 tablet by mouth daily. 1 Package 11  . [DISCONTINUED] medroxyPROGESTERone (DEPO-PROVERA) 150 MG/ML injection Inject 150 mg into  the muscle every 3 (three) months.      No current facility-administered medications on file prior to visit.    BP 100/60 mmHg  Pulse 87  Temp(Src) 98.8 F (37.1 C) (Oral)  Ht 5\' 5"  (1.651 m)  Wt 121 lb (54.885 kg)  BMI 20.14 kg/m2  LMP 02/10/2016chart    Objective:   Physical Exam  Constitutional: She is oriented to person, place, and time. She appears well-developed and well-nourished.  HENT:  Right Ear: External ear normal.  Left Ear: External ear normal.  Nose: Nose normal.  Mouth/Throat: Oropharynx is clear and moist.  Neck: Normal range of motion. Neck supple. No thyromegaly present.  Cardiovascular: Normal rate, regular rhythm and normal heart sounds.   Pulmonary/Chest: Effort normal and breath sounds normal. Right breast exhibits no inverted nipple, no mass, no nipple discharge, no skin change and no tenderness. Left breast exhibits no inverted nipple, no mass, no nipple discharge, no skin change and no tenderness. Breasts are symmetrical.  Abdominal: Soft. Bowel sounds are normal.  Genitourinary: Vagina normal and uterus normal. Guaiac negative stool. No vaginal discharge found.  Musculoskeletal: Normal range of motion.  Neurological: She is alert and oriented to person, place, and time.  Skin: Skin is warm.  Psychiatric: She has a normal mood and affect.          Assessment & Plan:  Grace [name] was seen today for contraception.  Diagnoses  and all orders for this visit:  Screening for malignant neoplasm of cervix Orders: -     PAP [[place]]  Screen for STD (sexually transmitted disease) Orders: -     PAP [[place]] -     RPR -     HIV antibody (with reflex) -     HSV 2 Antibody, IgG  Other orders -     Norgestimate-Ethinyl Estradiol Triphasic (ORTHO TRI-CYCLEN LO) 0.18/0.215/0.25 MG-25 MCG tab; Take 1 tablet by mouth daily.    encouraged healthy diet, exercise, monthly self breast exams, and safe sex practices. Call the office with any questions or  concerns. Recheck in one year and sooner as needed.

## 2015-02-06 LAB — RPR

## 2015-02-06 LAB — CYTOLOGY - PAP

## 2015-02-06 LAB — HIV ANTIBODY (ROUTINE TESTING W REFLEX): HIV 1&2 Ab, 4th Generation: NONREACTIVE

## 2015-02-06 LAB — HSV 2 ANTIBODY, IGG: HSV 2 Glycoprotein G Ab, IgG: <0.1 IV

## 2015-02-07 LAB — CERVICOVAGINAL ANCILLARY ONLY
BACTERIAL VAGINITIS: NEGATIVE
Candida vaginitis: NEGATIVE

## 2015-02-17 ENCOUNTER — Telehealth: Payer: Self-pay | Admitting: Family

## 2015-02-17 MED ORDER — LEVONORGEST-ETH ESTRAD 91-DAY 0.15-0.03 &0.01 MG PO TABS: 1.0000 | ORAL_TABLET | Freq: Every day | ORAL | Status: DC

## 2015-02-17 NOTE — Telephone Encounter (Signed)
Pt request refill of the following: Levonorgestrel-Ethinyl Estradiol (AMETHIA) 0.15-0.03 &0.01 MG tablet  Pt said thie above is the correct rx that she should received and it should be 90 day supply    Phamacy: [facility] [address]

## 2015-02-17 NOTE — Telephone Encounter (Signed)
Done

## 2015-07-17 ENCOUNTER — Emergency Department (HOSPITAL_COMMUNITY)
Admission: EM | Admit: 2015-07-17 | Discharge: 2015-07-17 | Disposition: A | Discharge status: Home / Self Care | Payer: BLUE CROSS/BLUE SHIELD | Attending: Emergency Medicine | Admitting: Emergency Medicine

## 2015-07-17 ENCOUNTER — Encounter (HOSPITAL_COMMUNITY): Payer: Self-pay | Admitting: Emergency Medicine

## 2015-07-17 DIAGNOSIS — Z87891 Personal history of nicotine dependence: Secondary | ICD-10-CM | POA: Diagnosis not present

## 2015-07-17 DIAGNOSIS — F10120 Alcohol abuse with intoxication, uncomplicated: Secondary | ICD-10-CM | POA: Diagnosis not present

## 2015-07-17 DIAGNOSIS — Z793 Long term (current) use of hormonal contraceptives: Secondary | ICD-10-CM | POA: Diagnosis not present

## 2015-07-17 DIAGNOSIS — Z8619 Personal history of other infectious and parasitic diseases: Secondary | ICD-10-CM | POA: Diagnosis not present

## 2015-07-17 DIAGNOSIS — Z3202 Encounter for pregnancy test, result negative: Secondary | ICD-10-CM | POA: Insufficient documentation

## 2015-07-17 DIAGNOSIS — R112 Nausea with vomiting, unspecified: Secondary | ICD-10-CM | POA: Diagnosis present

## 2015-07-17 DIAGNOSIS — Z8742 Personal history of other diseases of the female genital tract: Secondary | ICD-10-CM | POA: Diagnosis not present

## 2015-07-17 LAB — COMPREHENSIVE METABOLIC PANEL
ALT: 27 U/L (ref 14–54)
AST: 34 U/L (ref 15–41)
Albumin: 4.5 g/dL (ref 3.5–5.0)
Alkaline Phosphatase: 49 U/L (ref 38–126)
Anion gap: 12 (ref 5–15)
BILIRUBIN TOTAL: 0.3 mg/dL (ref 0.3–1.2)
BUN: 9 mg/dL (ref 6–20)
CHLORIDE: 112 mmol/L — AB (ref 101–111)
CO2: 21 mmol/L — AB (ref 22–32)
Calcium: 9.5 mg/dL (ref 8.9–10.3)
Creatinine, Ser: 0.71 mg/dL (ref 0.44–1.00)
GFR calc Af Amer: >60 mL/min (ref >60)
Glucose, Bld: 81 mg/dL (ref 65–99)
POTASSIUM: 3.5 mmol/L (ref 3.5–5.1)
SODIUM: 145 mmol/L (ref 135–145)
Total Protein: 8.2 g/dL — ABNORMAL HIGH (ref 6.5–8.1)

## 2015-07-17 LAB — CBC
HEMATOCRIT: 41.6 % (ref 36.0–46.0)
HEMOGLOBIN: 14.1 g/dL (ref 12.0–15.0)
MCH: 28.7 pg (ref 26.0–34.0)
MCHC: 33.9 g/dL (ref 30.0–36.0)
MCV: 84.7 fL (ref 78.0–100.0)
Platelets: 251 10*3/uL (ref 150–400)
RBC: 4.91 MIL/uL (ref 3.87–5.11)
RDW: 12.5 % (ref 11.5–15.5)
WBC: 3.9 10*3/uL — ABNORMAL LOW (ref 4.0–10.5)

## 2015-07-17 LAB — POC URINE PREG, ED: Preg Test, Ur: NEGATIVE

## 2015-07-17 LAB — LIPASE, BLOOD: Lipase: 21 U/L — ABNORMAL LOW (ref 22–51)

## 2015-07-17 MED ORDER — ONDANSETRON 4 MG PO TBDP
ORAL_TABLET | ORAL | Status: AC
  Filled 2015-07-17: qty 1

## 2015-07-17 MED ORDER — ONDANSETRON 4 MG PO TBDP
4.0000 mg | ORAL_TABLET | Freq: Once | ORAL | Status: AC | PRN
  Administered 2015-07-17: 4 mg via ORAL

## 2015-07-17 MED ORDER — ACETAMINOPHEN 500 MG PO TABS
1000.0000 mg | ORAL_TABLET | Freq: Once | ORAL | Status: DC
  Filled 2015-07-17: qty 2

## 2015-07-17 MED ORDER — SODIUM CHLORIDE 0.9 % IV BOLUS (SEPSIS)
1000.0000 mL | Freq: Once | INTRAVENOUS | Status: AC
  Administered 2015-07-17: 1000 mL via INTRAVENOUS

## 2015-07-17 NOTE — ED Notes (Signed)
Pt vomiting at triage, states she is afraid she has alcohol poisoning.

## 2015-07-17 NOTE — ED Notes (Signed)
Pt drank a large amount of etoh last night-- for birthday-- does not remember, but has been throwing up-- 4 x today-- "feels like something stuck in throat" throat red, tonsils swollen

## 2015-07-17 NOTE — Discharge Instructions (Signed)
Alcohol Use Disorder °Alcohol use disorder is a mental disorder. It is not a one-time incident of heavy drinking. Alcohol use disorder is the excessive and uncontrollable use of alcohol over time that leads to problems with functioning in one or more areas of daily living. People with this disorder risk harming themselves and others when they drink to excess. Alcohol use disorder also can cause other mental disorders, such as mood and anxiety disorders, and serious physical problems. People with alcohol use disorder often misuse other drugs.  °Alcohol use disorder is common and widespread. Some people with this disorder drink alcohol to cope with or escape from negative life events. Others drink to relieve chronic pain or symptoms of mental illness. People with a family history of alcohol use disorder are at higher risk of losing control and using alcohol to excess.  °SYMPTOMS  °Signs and symptoms of alcohol use disorder may include the following:  °· Consumption of alcohol in larger amounts or over a longer period of time than intended. °· Multiple unsuccessful attempts to cut down or control alcohol use.   °· A great deal of time spent obtaining alcohol, using alcohol, or recovering from the effects of alcohol (hangover). °· A strong desire or urge to use alcohol (cravings).   °· Continued use of alcohol despite problems at work, school, or home because of alcohol use.   °· Continued use of alcohol despite problems in relationships because of alcohol use. °· Continued use of alcohol in situations when it is physically hazardous, such as driving a car. °· Continued use of alcohol despite awareness of a physical or psychological problem that is likely related to alcohol use. Physical problems related to alcohol use can involve the brain, heart, liver, stomach, and intestines. Psychological problems related to alcohol use include intoxication, depression, anxiety, psychosis, delirium, and dementia.   °· The need for  increased amounts of alcohol to achieve the same desired effect, or a decreased effect from the consumption of the same amount of alcohol (tolerance). °· Withdrawal symptoms upon reducing or stopping alcohol use, or alcohol use to reduce or avoid withdrawal symptoms. Withdrawal symptoms include: °· Racing heart. °· Hand tremor. °· Difficulty sleeping. °· Nausea. °· Vomiting. °· Hallucinations. °· Restlessness. °· Seizures. °DIAGNOSIS °Alcohol use disorder is diagnosed through an assessment by your health care provider. Your health care provider may start by asking three or four questions to screen for excessive or problematic alcohol use. To confirm a diagnosis of alcohol use disorder, at least two symptoms must be present within a 12-month period. The severity of alcohol use disorder depends on the number of symptoms: °· Mild--two or three. °· Moderate--four or five. °· Severe--six or more. °Your health care provider may perform a physical exam or use results from lab tests to see if you have physical problems resulting from alcohol use. Your health care provider may refer you to a mental health professional for evaluation. °TREATMENT  °Some people with alcohol use disorder are able to reduce their alcohol use to low-risk levels. Some people with alcohol use disorder need to quit drinking alcohol. When necessary, mental health professionals with specialized training in substance use treatment can help. Your health care provider can help you decide how severe your alcohol use disorder is and what type of treatment you need. The following forms of treatment are available:  °· Detoxification. Detoxification involves the use of prescription medicines to prevent alcohol withdrawal symptoms in the first week after quitting. This is important for people with a history of symptoms   of withdrawal and for heavy drinkers who are likely to have withdrawal symptoms. Alcohol withdrawal can be dangerous and, in severe cases, cause  death. Detoxification is usually provided in a hospital or in-patient substance use treatment facility. °· Counseling or talk therapy. Talk therapy is provided by substance use treatment counselors. It addresses the reasons people use alcohol and ways to keep them from drinking again. The goals of talk therapy are to help people with alcohol use disorder find healthy activities and ways to cope with life stress, to identify and avoid triggers for alcohol use, and to handle cravings, which can cause relapse. °· Medicines. Different medicines can help treat alcohol use disorder through the following actions: °¨ Decrease alcohol cravings. °¨ Decrease the positive reward response felt from alcohol use. °¨ Produce an uncomfortable physical reaction when alcohol is used (aversion therapy). °· Support groups. Support groups are run by people who have quit drinking. They provide emotional support, advice, and guidance. °These forms of treatment are often combined. Some people with alcohol use disorder benefit from intensive combination treatment provided by specialized substance use treatment centers. Both inpatient and outpatient treatment programs are available. °Document Released: 12/30/2004 Document Revised: 04/08/2014 Document Reviewed: 03/01/2013 °ExitCare® Patient Information ©2015 [employer]. This information is not intended to replace advice given to you by your health care provider. Make sure you discuss any questions you have with your health care provider. ° ° ° ° °Alcohol Withdrawal °Anytime drug use is interfering with normal living activities it has become abuse. This includes problems with family and friends. Psychological dependence has developed when your mind tells you that the drug is needed. This is usually followed by physical dependence when a continuing increase of drugs are required to get the same feeling or "high." This is known as addiction or chemical dependency. A person's risk is much  higher if there is a history of chemical dependency in the family. °Mild Withdrawal Following Stopping Alcohol, When Addiction or Chemical Dependency Has Developed °When a person has developed tolerance to alcohol, any sudden stopping of alcohol can cause uncomfortable physical symptoms. Most of the time these are mild and consist of tremors in the hands and increases in heart rate, breathing, and temperature. Sometimes these symptoms are associated with anxiety, panic attacks, and bad dreams. There may also be stomach upset. Normal sleep patterns are often interrupted with periods of inability to sleep (insomnia). This may last for 6 months. Because of this discomfort, many people choose to continue drinking to get rid of this discomfort and to try to feel normal. °Severe Withdrawal with Decreased or No Alcohol Intake, When Addiction or Chemical Dependency Has Developed °About five percent of alcoholics will develop signs of severe withdrawal when they stop using alcohol. One sign of this is development of generalized seizures (convulsions). Other signs of this are severe agitation and confusion. This may be associated with believing in things which are not real or seeing things which are not really there (delusions and hallucinations). Vitamin deficiencies are usually present if alcohol intake has been long-term. Treatment for this most often requires hospitalization and close observation. °Addiction can only be helped by stopping use of all chemicals. This is hard but may save your life. With continual alcohol use, possible outcomes are usually loss of self respect and esteem, violence, and death. °Addiction cannot be cured but it can be stopped. This often requires outside help and the care of professionals. Treatment centers are listed in the yellow pages under Cocaine,   Narcotics, and Alcoholics Anonymous. Most hospitals and clinics can refer you to a specialized care center. °It is not necessary for you to go  through the uncomfortable symptoms of withdrawal. Your caregiver can provide you with medicines that will help you through this difficult period. Try to avoid situations, friends, or drugs that made it possible for you to keep using alcohol in the past. Learn how to say no. °It takes a long period of time to overcome addictions to all drugs, including alcohol. There may be many times when you feel as though you want a drink. After getting rid of the physical addiction and withdrawal, you will have a lessening of the craving which tells you that you need alcohol to feel normal. Call your caregiver if more support is needed. Learn who to talk to in your family and among your friends so that during these periods you can receive outside help. Alcoholics Anonymous (AA) has helped many people over the years. To get further help, contact AA or call your caregiver, counselor, or clergyperson. Al-Anon and Alateen are support groups for friends and family members of an alcoholic. The people who love and care for an alcoholic often need help, too. For information about these organizations, check your phone directory or call a local alcoholism treatment center.  °SEEK IMMEDIATE MEDICAL CARE IF:  °· You have a seizure. °· You have a fever. °· You experience uncontrolled vomiting or you vomit up blood. This may be bright red or look like black coffee grounds. °· You have blood in the stool. This may be bright red or appear as a black, tarry, bad-smelling stool. °· You become lightheaded or faint. Do not drive if you feel this way. Have someone else drive you or call 911 for help. °· You become more agitated or confused. °· You develop uncontrolled anxiety. °· You begin to see things that are not really there (hallucinate). °Your caregiver has determined that you completely understand your medical condition, and that your mental state is back to normal. You understand that you have been treated for alcohol withdrawal, have agreed  not to drink any alcohol for a minimum of 1 day, will not operate a car or other machinery for 24 hours, and have had an opportunity to ask any questions about your condition. °Document Released: 09/01/2005 Document Revised: 02/14/2012 Document Reviewed: 07/10/2008 °ExitCare® Patient Information ©2015 [employer]. This information is not intended to replace advice given to you by your health care provider. Make sure you discuss any questions you have with your health care provider. ° °

## 2015-07-17 NOTE — ED Provider Notes (Signed)
CSN: [id number]     Arrival date & time 07/17/15  1405 History   First MD Initiated Contact with Patient 07/17/15 1614     Chief Complaint  Patient presents with  . Nausea  . Emesis     (Consider location/radiation/quality/duration/timing/severity/associated sxs/prior Treatment) Patient is a 24 y.o. female presenting with vomiting. The history is provided by the patient and a parent.  Emesis Severity:  Moderate Duration:  1 day Timing:  Constant Quality:  Stomach contents Progression:  Unchanged Chronicity:  New Recent urination:  Normal Relieved by:  Nothing Worsened by:  Nothing tried Ineffective treatments:  None tried Associated symptoms: sore throat   Associated symptoms: no fever   Sore throat:    Severity:  Moderate   Onset quality:  Gradual   Timing:  Constant   Progression:  Unchanged Risk factors: alcohol use   Risk factors: no diabetes     Past Medical History  Diagnosis Date  . Bacterial vaginosis   . Chlamydia    History reviewed. No pertinent past surgical history. Family History  Problem Relation Age of Onset  . Arthritis Maternal Grandmother   . Heart disease Maternal Grandmother   . Stroke Maternal Grandmother   . Hypertension Maternal Grandmother    Social History  Substance Use Topics  . Smoking status: Former Smoker    Quit date: 10/28/2011  . Smokeless tobacco: None     Comment: pt notes that she quit about 1 month ago  . Alcohol Use: Yes     Comment: twice a month   OB History    No data available     Review of Systems  HENT: Positive for sore throat.   Gastrointestinal: Positive for vomiting.  All other systems reviewed and are negative.     Allergies  Review of patient's allergies indicates no known allergies.  Home Medications   Prior to Admission medications   Medication Sig Start Date End Date Taking? Authorizing Provider  Levonorgestrel-Ethinyl Estradiol (AMETHIA) 0.15-0.03 &0.01 MG tablet Take 1 tablet by mouth  daily. 02/17/15   Eulis Foster, FNP   BP 116/83 mmHg  Pulse 90  Temp(Src) 98.7 F (37.1 C) (Oral)  Ht 5\' 5"  (1.651 m)  Wt 126 lb (57.153 kg)  BMI 20.97 kg/m2  SpO2 99%  LMP 07/09/2015 (Exact Date) Physical Exam  Constitutional: She is oriented to person, place, and time. She appears well-developed and well-nourished. No distress.  HENT:  Head: Normocephalic and atraumatic.  Mouth/Throat: Oropharynx is clear and moist.  Eyes: Conjunctivae are normal.  Neck: Neck supple. No tracheal deviation present.  Cardiovascular: Normal rate and regular rhythm.   Pulmonary/Chest: Effort normal. No respiratory distress.  Abdominal: Soft. She exhibits no distension.  Neurological: She is alert and oriented to person, place, and time.  Skin: Skin is warm and dry.  Psychiatric: She has a normal mood and affect.  Vitals reviewed.   ED Course  Procedures (including critical care time) Labs Review Labs Reviewed  LIPASE, BLOOD - Abnormal; Notable for the following:    Lipase 21 (*)    All other components within normal limits  COMPREHENSIVE METABOLIC PANEL - Abnormal; Notable for the following:    Chloride 112 (*)    CO2 21 (*)    Total Protein 8.2 (*)    All other components within normal limits  CBC - Abnormal; Notable for the following:    WBC 3.9 (*)    All other components within normal limits  POC URINE PREG,  ED    Imaging Review No results found. Gregery Na, personally reviewed and evaluated these images and lab results as part of my medical decision-making.   EKG Interpretation None      MDM   Final diagnoses:  Alcohol intake above recommended sensible limits, uncomplicated  Hangover without complication    24 year old female presents after emesis following acute alcohol consumption last night for her birthday. She is otherwise well-appearing, afebrile with stable vital signs, no signs of pancreatitis or end organ dysfunction secondary to vomiting. Provided an IV  fluid bolus and supportive care measures, advised on alcohol abuse complications. Plan to follow up with PCP as needed and return precautions discussed for worsening or new concerning symptoms.   Lyndal Pulley, MD 07/17/15 [phone]

## 2016-05-20 ENCOUNTER — Ambulatory Visit: Payer: BLUE CROSS/BLUE SHIELD | Admitting: Family Medicine

## 2016-05-20 DIAGNOSIS — Z0289 Encounter for other administrative examinations: Secondary | ICD-10-CM

## 2016-05-25 ENCOUNTER — Ambulatory Visit (INDEPENDENT_AMBULATORY_CARE_PROVIDER_SITE_OTHER): Payer: BLUE CROSS/BLUE SHIELD | Admitting: Family Medicine

## 2016-05-25 ENCOUNTER — Other Ambulatory Visit (HOSPITAL_COMMUNITY)
Admission: RE | Admit: 2016-05-25 | Discharge: 2016-05-25 | Disposition: A | Discharge status: Home / Self Care | Payer: BLUE CROSS/BLUE SHIELD | Source: Ambulatory Visit | Attending: Family Medicine | Admitting: Family Medicine

## 2016-05-25 ENCOUNTER — Encounter: Payer: Self-pay | Admitting: Family Medicine

## 2016-05-25 DIAGNOSIS — N760 Acute vaginitis: Secondary | ICD-10-CM | POA: Diagnosis present

## 2016-05-25 DIAGNOSIS — N898 Other specified noninflammatory disorders of vagina: Secondary | ICD-10-CM | POA: Diagnosis not present

## 2016-05-25 DIAGNOSIS — Z113 Encounter for screening for infections with a predominantly sexual mode of transmission: Secondary | ICD-10-CM | POA: Insufficient documentation

## 2016-05-25 MED ORDER — METRONIDAZOLE 500 MG PO TABS: 500.0000 mg | ORAL_TABLET | Freq: Two times a day (BID) | ORAL | Status: DC

## 2016-05-25 NOTE — Patient Instructions (Signed)

## 2016-05-25 NOTE — Addendum Note (Signed)
Addended by: Tempie HoistMCNEIL, AUTUMN M on: 05/25/2016 01:34 PM   Modules accepted: Orders

## 2016-05-25 NOTE — Progress Notes (Signed)
   Subjective:    Patient ID: Grace [name], female    DOB: 10-01-1991, [age] y.o.   MRN: [id number]  HPI Patient seen with about 2 week history of some odorous vaginal discharge. Similar discharge in the past with bacterial vaginosis. She is sexually active with one partner. No history of STD. No barrier protection. Not using contraception. No dysuria. No fevers or chills. No alleviating or exacerbating factors. Menses have been regular  Past Medical History  Diagnosis Date  . Bacterial vaginosis   . Chlamydia    History reviewed. No pertinent past surgical history.  reports that she quit smoking about 4 years ago. She does not have any smokeless tobacco history on file. She reports that she drinks alcohol. She reports that she does not use illicit drugs. family history includes Arthritis in her maternal grandmother; Heart disease in her maternal grandmother; Hypertension in her maternal grandmother; Stroke in her maternal grandmother. No Known Allergies    Review of Systems  Constitutional: Negative for fever and chills.  Genitourinary: Positive for vaginal discharge. Negative for dysuria, hematuria and vaginal pain.       Objective:   Physical Exam  Constitutional: She appears well-developed and well-nourished.  Cardiovascular: Normal rate and regular rhythm.   Pulmonary/Chest: Effort normal and breath sounds normal. No respiratory distress. She has no wheezes. She has no rales.  Genitourinary: Vaginal discharge found.  Normal external genitalia. She has small amount of thin yellow vaginal mucus. No other abnormalities noted          Assessment & Plan:  Vaginal discharge. Probable bacterial vaginosis. Wet prep obtained. Start metronidazole 500 mg twice a day for 7 days pending results. We discussed importance of STD prevention. Also suggested that she consider follow-up with primary to discuss birth control issues since she is not interested in getting pregnant at this  time  Grace CoveyBruce W Ketura Sirek MD [facility]

## 2016-05-25 NOTE — Progress Notes (Signed)
Pre visit review using our clinic review tool, if applicable. No additional management support is needed unless otherwise documented below in the visit note. 

## 2016-05-26 LAB — CERVICOVAGINAL ANCILLARY ONLY
Chlamydia: NEGATIVE
NEISSERIA GONORRHEA: NEGATIVE
Trichomonas: NEGATIVE

## 2016-05-31 LAB — CERVICOVAGINAL ANCILLARY ONLY: CANDIDA VAGINITIS: NEGATIVE

## 2016-08-27 ENCOUNTER — Ambulatory Visit (INDEPENDENT_AMBULATORY_CARE_PROVIDER_SITE_OTHER): Payer: BLUE CROSS/BLUE SHIELD | Admitting: Family Medicine

## 2016-08-27 VITALS — BP 110/70 | HR 90 | Temp 98.5°F | Ht 65.0 in | Wt 138.0 lb

## 2016-08-27 DIAGNOSIS — N898 Other specified noninflammatory disorders of vagina: Secondary | ICD-10-CM | POA: Diagnosis not present

## 2016-08-27 MED ORDER — METRONIDAZOLE 500 MG PO TABS: 500.0000 mg | ORAL_TABLET | Freq: Two times a day (BID) | ORAL | 0 refills | Status: DC

## 2016-08-27 NOTE — Progress Notes (Signed)
Pre visit review using our clinic review tool, if applicable. No additional management support is needed unless otherwise documented below in the visit note. 

## 2016-08-27 NOTE — Progress Notes (Signed)
Subjective:     Patient ID: Grace [name], female   DOB: 1991/10/11, 25 y.o.   MRN: [id number]  HPI Patient seen with recurrent vaginal discharge with what she describes as a "fishy "odor. Back in June we did wet prep and this came back positive for bacterial vaginosis. She denies any new partners. No history of douching. No IUD use. No dysuria. No skin lesions.  Past Medical History:  Diagnosis Date  . Bacterial vaginosis   . Chlamydia    No past surgical history on file.  reports that she quit smoking about 4 years ago. She does not have any smokeless tobacco history on file. She reports that she drinks alcohol. She reports that she does not use drugs. family history includes Arthritis in her maternal grandmother; Heart disease in her maternal grandmother; Hypertension in her maternal grandmother; Stroke in her maternal grandmother. No Known Allergies   Review of Systems  Constitutional: Negative for chills and fever.  Genitourinary: Positive for vaginal discharge. Negative for dysuria, genital sores, pelvic pain and vaginal bleeding.       Objective:   Physical Exam  Constitutional: She appears well-developed and well-nourished.  Cardiovascular: Normal rate and regular rhythm.   Pulmonary/Chest: Effort normal and breath sounds normal. No respiratory distress. She has no wheezes. She has no rales.       Assessment:     Vaginal discharge. Suspect recurrent bacterial vaginosis.    Plan:     -We discussed possibly checking another repeat wet prep but her symptoms are exactly as they were in June -Metronidazole 500 mg twice a day for 7 days -We discussed possible prophylaxis with metronidazole gel (couple times per week) but this point she is not interested but if she has another recurrence would definitely consider    Kristian CoveyBruce W Deven Audi MD [facility]

## 2016-08-27 NOTE — Patient Instructions (Signed)

## 2016-09-13 ENCOUNTER — Ambulatory Visit: Payer: BLUE CROSS/BLUE SHIELD | Admitting: Family Medicine

## 2016-09-13 DIAGNOSIS — Z0289 Encounter for other administrative examinations: Secondary | ICD-10-CM

## 2016-09-29 ENCOUNTER — Ambulatory Visit: Payer: BLUE CROSS/BLUE SHIELD | Admitting: Adult Health

## 2016-10-06 ENCOUNTER — Ambulatory Visit: Payer: BLUE CROSS/BLUE SHIELD | Admitting: Family Medicine

## 2016-10-06 DIAGNOSIS — Z0289 Encounter for other administrative examinations: Secondary | ICD-10-CM

## 2017-09-20 ENCOUNTER — Encounter (HOSPITAL_COMMUNITY): Payer: Self-pay | Admitting: *Deleted

## 2017-09-20 ENCOUNTER — Emergency Department (HOSPITAL_COMMUNITY)
Admission: EM | Admit: 2017-09-20 | Discharge: 2017-09-21 | Disposition: A | Discharge status: Home / Self Care | Payer: 59 | Attending: Emergency Medicine | Admitting: Emergency Medicine

## 2017-09-20 DIAGNOSIS — R51 Headache: Secondary | ICD-10-CM | POA: Diagnosis not present

## 2017-09-20 DIAGNOSIS — Z87891 Personal history of nicotine dependence: Secondary | ICD-10-CM | POA: Insufficient documentation

## 2017-09-20 DIAGNOSIS — Y92410 Unspecified street and highway as the place of occurrence of the external cause: Secondary | ICD-10-CM | POA: Insufficient documentation

## 2017-09-20 DIAGNOSIS — Y998 Other external cause status: Secondary | ICD-10-CM | POA: Insufficient documentation

## 2017-09-20 DIAGNOSIS — Y939 Activity, unspecified: Secondary | ICD-10-CM | POA: Insufficient documentation

## 2017-09-20 DIAGNOSIS — S39012A Strain of muscle, fascia and tendon of lower back, initial encounter: Secondary | ICD-10-CM | POA: Insufficient documentation

## 2017-09-20 DIAGNOSIS — S3992XA Unspecified injury of lower back, initial encounter: Secondary | ICD-10-CM | POA: Diagnosis present
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
## 2017-09-21 ENCOUNTER — Emergency Department (HOSPITAL_COMMUNITY): Payer: 59

## 2017-09-21 LAB — POC URINE PREG, ED: Preg Test, Ur: NEGATIVE

## 2017-09-21 MED ORDER — NAPROXEN 500 MG PO TABS: 500.0000 mg | ORAL_TABLET | Freq: Two times a day (BID) | ORAL | 0 refills | Status: DC

## 2017-09-21 MED ORDER — KETOROLAC TROMETHAMINE 15 MG/ML IJ SOLN
15.0000 mg | Freq: Once | INTRAMUSCULAR | Status: AC
  Administered 2017-09-21: 15 mg via INTRAMUSCULAR
  Filled 2017-09-21: qty 1

## 2017-09-21 MED ORDER — CYCLOBENZAPRINE HCL 10 MG PO TABS: 10.0000 mg | ORAL_TABLET | Freq: Two times a day (BID) | ORAL | 0 refills | Status: DC | PRN
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
## 2017-09-21 NOTE — Discharge Instructions (Signed)
Please read attached information regarding your condition. Take muscle relaxer and anti-inflammatories as directed. Apply heating pad and stretch area as tolerated. Return to ED for worsening headache, vision changes, vomiting, numbness in legs, additional injury.

## 2017-11-17 ENCOUNTER — Encounter: Payer: Self-pay | Admitting: Family Medicine

## 2017-11-17 ENCOUNTER — Ambulatory Visit (INDEPENDENT_AMBULATORY_CARE_PROVIDER_SITE_OTHER): Payer: 59 | Admitting: Family Medicine

## 2017-11-17 VITALS — BP 118/62 | HR 91 | Ht 64.0 in | Wt 162.8 lb

## 2017-11-17 DIAGNOSIS — N898 Other specified noninflammatory disorders of vagina: Secondary | ICD-10-CM

## 2017-11-17 DIAGNOSIS — Z7189 Other specified counseling: Secondary | ICD-10-CM

## 2017-11-17 DIAGNOSIS — Z0000 Encounter for general adult medical examination without abnormal findings: Secondary | ICD-10-CM

## 2017-11-17 DIAGNOSIS — Z113 Encounter for screening for infections with a predominantly sexual mode of transmission: Secondary | ICD-10-CM

## 2017-11-17 DIAGNOSIS — Z23 Encounter for immunization: Secondary | ICD-10-CM

## 2017-11-17 DIAGNOSIS — Z7185 Encounter for immunization safety counseling: Secondary | ICD-10-CM

## 2017-11-17 LAB — POCT URINALYSIS DIP (PROADVANTAGE DEVICE)
BILIRUBIN UA: NEGATIVE mg/dL
Bilirubin, UA: NEGATIVE
Blood, UA: NEGATIVE
Glucose, UA: NEGATIVE mg/dL
Leukocytes, UA: NEGATIVE
Nitrite, UA: NEGATIVE
Protein Ur, POC: NEGATIVE mg/dL
Specific Gravity, Urine: 1.03
Urobilinogen, Ur: NEGATIVE
pH, UA: 6 (ref 5.0–8.0)

## 2017-11-17 LAB — POCT WET PREP (WET MOUNT)
CLUE CELLS WET PREP WHIFF POC: NEGATIVE
KOH WET PREP POC: NEGATIVE
Trichomonas Wet Prep HPF POC: ABSENT

## 2017-11-17 NOTE — Patient Instructions (Addendum)
You received your first of 3 Gardasil injections today. Return in 1-2 months for your second injection.  You will need your third and final Gardasil injection in 6 months.  We will call you with your lab results. Your urine is normal today.  Preventative Care for Adults - Female      MAINTAIN REGULAR HEALTH EXAMS:  A routine yearly physical is a good way to check in with your primary care provider about your health and preventive screening. It is also an opportunity to share updates about your health and any concerns you have, and receive a thorough all-over exam.   Most health insurance companies pay for at least some preventative services.  Check with your health plan for specific coverages.  WHAT PREVENTATIVE SERVICES DO WOMEN NEED?  Adult women should have their weight and blood pressure checked regularly.   Women age 25 and older should have their cholesterol levels checked regularly.  Women should be screened for cervical cancer with a Pap smear and pelvic exam beginning at age 25.   Breast cancer screening generally begins at age 25 with a mammogram and breast exam by your primary care provider.    Beginning at age 250 and continuing to age 25, women should be screened for colorectal cancer.  Certain people may need continued testing until age 25.  Updating vaccinations is part of preventative care.  Vaccinations help protect against diseases such as the flu.  Osteoporosis is a disease in which the bones lose minerals and strength as we age. Women ages 5565 and over should discuss this with their caregivers, as should women after menopause who have other risk factors.  Lab tests are generally done as part of preventative care to screen for anemia and blood disorders, to screen for problems with the kidneys and liver, to screen for bladder problems, to check blood sugar, and to check your cholesterol level.  Preventative services generally include counseling about diet, exercise,  avoiding tobacco, drugs, excessive alcohol consumption, and sexually transmitted infections.    GENERAL RECOMMENDATIONS FOR GOOD HEALTH:  Healthy diet:  Eat a variety of foods, including fruit, vegetables, animal or vegetable protein, such as meat, fish, chicken, and eggs, or beans, lentils, tofu, and grains, such as rice.  Drink plenty of water daily.  Decrease saturated fat in the diet, avoid lots of red meat, processed foods, sweets, fast foods, and fried foods.  Exercise:  Aerobic exercise helps maintain good heart health. At least 30-40 minutes of moderate-intensity exercise is recommended. For example, a brisk walk that increases your heart rate and breathing. This should be done on most days of the week.   Find a type of exercise or a variety of exercises that you enjoy so that it becomes a part of your daily life.  Examples are running, walking, swimming, water aerobics, and biking.  For motivation and support, explore group exercise such as aerobic class, spin class, Zumba, Yoga,or  martial arts, etc.    Set exercise goals for yourself, such as a certain weight goal, walk or run in a race such as a 5k walk/run.  Speak to your primary care provider about exercise goals.  Disease prevention:  If you smoke or chew tobacco, find out from your caregiver how to quit. It can literally save your life, no matter how long you have been a tobacco user. If you do not use tobacco, never begin.   Maintain a healthy diet and normal weight. Increased weight leads to problems with  blood pressure and diabetes.   The Body Mass Index or BMI is a way of measuring how much of your body is fat. Having a BMI above 27 increases the risk of heart disease, diabetes, hypertension, stroke and other problems related to obesity. Your caregiver can help determine your BMI and based on it develop an exercise and dietary program to help you achieve or maintain this important measurement at a healthful  level.  High blood pressure causes heart and blood vessel problems.  Persistent high blood pressure should be treated with medicine if weight loss and exercise do not work.   Fat and cholesterol leaves deposits in your arteries that can block them. This causes heart disease and vessel disease elsewhere in your body.  If your cholesterol is found to be high, or if you have heart disease or certain other medical conditions, then you may need to have your cholesterol monitored frequently and be treated with medication.   Ask if you should have a cardiac stress test if your history suggests this. A stress test is a test done on a treadmill that looks for heart disease. This test can find disease prior to there being a problem.  Menopause can be associated with physical symptoms and risks. Hormone replacement therapy is available to decrease these. You should talk to your caregiver about whether starting or continuing to take hormones is right for you.   Osteoporosis is a disease in which the bones lose minerals and strength as we age. This can result in serious bone fractures. Risk of osteoporosis can be identified using a bone density scan. Women ages 7565 and over should discuss this with their caregivers, as should women after menopause who have other risk factors. Ask your caregiver whether you should be taking a calcium supplement and Vitamin D, to reduce the rate of osteoporosis.   Avoid drinking alcohol in excess (more than two drinks per day).  Avoid use of street drugs. Do not share needles with anyone. Ask for professional help if you need assistance or instructions on stopping the use of alcohol, cigarettes, and/or drugs.  Brush your teeth twice a day with fluoride toothpaste, and floss once a day. Good oral hygiene prevents tooth decay and gum disease. The problems can be painful, unattractive, and can cause other health problems. Visit your dentist for a routine oral and dental check up and  preventive care every 6-12 months.   Look at your skin regularly.  Use a mirror to look at your back. Notify your caregivers of changes in moles, especially if there are changes in shapes, colors, a size larger than a pencil eraser, an irregular border, or development of new moles.  Safety:  Use seatbelts 100% of the time, whether driving or as a passenger.  Use safety devices such as hearing protection if you work in environments with loud noise or significant background noise.  Use safety glasses when doing any work that could send debris in to the eyes.  Use a helmet if you ride a bike or motorcycle.  Use appropriate safety gear for contact sports.  Talk to your caregiver about gun safety.  Use sunscreen with a SPF (or skin protection factor) of 15 or greater.  Lighter skinned people are at a greater risk of skin cancer. Don't forget to also wear sunglasses in order to protect your eyes from too much damaging sunlight. Damaging sunlight can accelerate cataract formation.   Practice safe sex. Use condoms. Condoms are used for birth  control and to help reduce the spread of sexually transmitted infections (or STIs).  Some of the STIs are gonorrhea (the clap), chlamydia, syphilis, trichomonas, herpes, HPV (human papilloma virus) and HIV (human immunodeficiency virus) which causes AIDS. The herpes, HIV and HPV are viral illnesses that have no cure. These can result in disability, cancer and death.   Keep carbon monoxide and smoke detectors in your home functioning at all times. Change the batteries every 6 months or use a model that plugs into the wall.   Vaccinations:  Stay up to date with your tetanus shots and other required immunizations. You should have a booster for tetanus every 10 years. Be sure to get your flu shot every year, since 5%-20% of the [place] population comes down with the flu. The flu vaccine changes each year, so being vaccinated once is not enough. Get your shot in the fall, before  the flu season peaks.   Other vaccines to consider:  Human Papilloma Virus or HPV causes cancer of the cervix, and other infections that can be transmitted from person to person. There is a vaccine for HPV, and females should get immunized between the ages of 1711 and 2226. It requires a series of 3 shots.   Pneumococcal vaccine to protect against certain types of pneumonia.  This is normally recommended for adults age 25 or older.  However, adults younger than 25 years old with certain underlying conditions such as diabetes, heart or lung disease should also receive the vaccine.  Shingles vaccine to protect against Varicella Zoster if you are older than age 25, or younger than 25 years old with certain underlying illness.  Hepatitis A vaccine to protect against a form of infection of the liver by a virus acquired from food.  Hepatitis B vaccine to protect against a form of infection of the liver by a virus acquired from blood or body fluids, particularly if you work in health care.  If you plan to travel internationally, check with your local health department for specific vaccination recommendations.  Cancer Screening:  Breast cancer screening is essential to preventive care for women. All women age 25 and older should perform a breast self-exam every month. At age 25 and older, women should have their caregiver complete a breast exam each year. Women at ages 8040 and older should have a mammogram (x-ray film) of the breasts. Your caregiver can discuss how often you need mammograms.    Cervical cancer screening includes taking a Pap smear (sample of cells examined under a microscope) from the cervix (end of the uterus). It also includes testing for HPV (Human Papilloma Virus, which can cause cervical cancer). Screening and a pelvic exam should begin at age 25, or 3 years after a woman becomes sexually active. Screening should occur every year, with a Pap smear but no HPV testing, up to age 25. After  age 25, you should have a Pap smear every 3 years with HPV testing, if no HPV was found previously.   Most routine colon cancer screening begins at the age of 25. On a yearly basis, doctors may provide special easy to use take-home tests to check for hidden blood in the stool. Sigmoidoscopy or colonoscopy can detect the earliest forms of colon cancer and is life saving. These tests use a small camera at the end of a tube to directly examine the colon. Speak to your caregiver about this at age 25, when routine screening begins (and is repeated every 5 years unless  early forms of pre-cancerous polyps or small growths are found).

## 2017-11-17 NOTE — Progress Notes (Signed)
Subjective:    Patient ID: Grace [name], female    DOB: [birth year], 26 y.o.   MRN: [id number]  HPI Chief Complaint  Patient presents with  . New Patient (Initial Visit)    possible yeast infection, bump on the inside of the ear, frequent urination   She is new to the practice and here for a complete physical exam. Previous medical care: [facility]  Last CPE: over a year  Reports a 1 week history of vaginal discharge that is clear and malodorous. Reports history of recurrent BV. Denies itching, dyspareunia.  Complains of a bump in her left ear for the past 2 months.   [facility]   Social history: Lives with her boyfriend, works as a Associate Professorpharmacy tech Diet: fairly healthy  Excerise: none   Immunizations: denies HPV in the past. Wants this today. Also wants flu shot.   Health maintenance:  Mammogram: never Colonoscopy: never Last Gynecological Exam: October 2017  Last Menstrual cycle: 11/05/2017 and regular  Pregnancies: 0 Unprotected sex  Last Dental Exam: 6 months ago  Last Eye Exam: years   Wears seatbelt always, smoke detectors in home and functioning, does not text while driving and feels safe in home environment.   Reviewed allergies, medications, past medical, surgical, family, and social history.    Review of Systems Review of Systems Constitutional: -fever, -chills, -sweats, -unexpected weight change,-fatigue ENT: -runny nose, -ear pain, -sore throat Cardiology:  -chest pain, -palpitations, -edema Respiratory: -cough, -shortness of breath, -wheezing Gastroenterology: -abdominal pain, -nausea, -vomiting, -diarrhea, -constipation  Hematology: -bleeding or bruising problems Musculoskeletal: -arthralgias, -myalgias, -joint swelling, -back pain Ophthalmology: -vision changes Urology: -dysuria, -difficulty urinating, -hematuria, -urinary frequency, -urgency Neurology: -headache, -weakness, -tingling, -numbness         Objective:   Physical Exam BP 118/62 (BP Location: Right Arm, Patient Position: Sitting)   Pulse 91   Ht 5\' 4"  (1.626 m)   Wt 162 lb 12.8 oz (73.8 kg)   LMP 11/05/2017 (Approximate)   SpO2 98%   BMI 27.94 kg/m   General Appearance:    Alert, cooperative, no distress, appears stated age  Head:    Normocephalic, without obvious abnormality, atraumatic  Eyes:    PERRL, conjunctiva/corneas clear, EOM's intact, fundi    benign  Ears:    Normal TM's and external ear canals  Nose:   Nares normal, mucosa normal, no drainage or sinus   tenderness  Throat:   Lips, mucosa, and tongue normal; teeth and gums normal  Neck:   Supple, no lymphadenopathy;  thyroid:  no   enlargement/tenderness/nodules; no carotid   bruit or JVD  Back:    Spine nontender, no curvature, ROM normal, no CVA     tenderness  Lungs:     Clear to auscultation bilaterally without wheezes, rales or     ronchi; respirations unlabored  Chest Wall:    No tenderness or deformity   Heart:    Regular rate and rhythm, S1 and S2 normal, no murmur, rub   or gallop  Breast Exam:    No tenderness, masses, or nipple discharge or inversion.      No axillary lymphadenopathy  Abdomen:     Soft, non-tender, nondistended, normoactive bowel sounds,    no masses, no hepatosplenomegaly  Genitalia:    Normal external genitalia without lesions. No discharge visualized in vaginal vault. Swab obtained.  Pap not performed, up to date. Chaperone present.   Rectal:    Not performed due  to age<40 and no related complaints  Extremities:   No clubbing, cyanosis or edema  Pulses:   2+ and symmetric all extremities  Skin:   Skin color, texture, turgor normal, no rashes or lesions  Lymph nodes:   Cervical, supraclavicular, and axillary nodes normal  Neurologic:   CNII-XII intact, normal strength, sensation and gait; reflexes 2+ and symmetric throughout          Psych:   Normal mood, affect, hygiene and grooming.    Urinalysis dipstick: negative        Assessment & Plan:  Routine general medical examination at a health care facility - Plan: CBC with Differential/Platelet, Comprehensive metabolic panel, POCT Urinalysis DIP (Proadvantage Device)  Vaginal discharge - Plan: C. trachomatis/N. gonorrhoeae RNA, RPR, HIV antibody, POCT Wet Prep ([place]et [place])  HPV vaccine counseling - Plan: HPV 9-valent vaccine,Recombinat  Needs flu shot - Plan: Flu Vaccine QUAD 36+ mos IM  Screen for STD (sexually transmitted disease) - Plan: C. trachomatis/N. gonorrhoeae RNA, RPR, HIV antibody  Wet prep neg BV, trich, yeast. She will let me know if vaginal discharge worsens or does not resolve.  STD testing done.  She appears to be doing well overall.  Discussed safety and health promotion.  Pap smear up to date.  HPV and flu shot given.  Vaccine counseling done.

## 2017-11-18 ENCOUNTER — Encounter: Payer: Self-pay | Admitting: Family Medicine

## 2017-11-18 LAB — TEST AUTHORIZATION

## 2017-11-18 LAB — COMPREHENSIVE METABOLIC PANEL
AG Ratio: 1.5 (calc) (ref 1.0–2.5)
ALBUMIN MSPROF: 4.3 g/dL (ref 3.6–5.1)
ALKALINE PHOSPHATASE (APISO): 51 U/L (ref 33–115)
ALT: 32 U/L — ABNORMAL HIGH (ref 6–29)
AST: 27 U/L (ref 10–30)
BILIRUBIN TOTAL: 0.3 mg/dL (ref 0.2–1.2)
BUN: 13 mg/dL (ref 7–25)
CALCIUM: 9.6 mg/dL (ref 8.6–10.2)
CHLORIDE: 106 mmol/L (ref 98–110)
CO2: 22 mmol/L (ref 20–32)
Creat: 0.71 mg/dL (ref 0.50–1.10)
Globulin: 2.9 g/dL (calc) (ref 1.9–3.7)
Glucose, Bld: 87 mg/dL (ref 65–99)
POTASSIUM: 4 mmol/L (ref 3.5–5.3)
SODIUM: 138 mmol/L (ref 135–146)
TOTAL PROTEIN: 7.2 g/dL (ref 6.1–8.1)

## 2017-11-18 LAB — HEPATITIS PANEL, ACUTE
Hep A IgM: NONREACTIVE
Hep B C IgM: NONREACTIVE
Hepatitis B Surface Ag: NONREACTIVE
Hepatitis C Ab: NONREACTIVE
SIGNAL TO CUT-OFF: 0.03 (ref <1.00)

## 2017-11-18 LAB — CBC WITH DIFFERENTIAL/PLATELET
Basophils Absolute: 22 cells/uL (ref 0–200)
Basophils Relative: 0.5 %
Eosinophils Absolute: 101 cells/uL (ref 15–500)
Eosinophils Relative: 2.3 %
HCT: 38.4 % (ref 35.0–45.0)
HEMOGLOBIN: 13.1 g/dL (ref 11.7–15.5)
Lymphs Abs: 2508 cells/uL (ref 850–3900)
MCH: 28.8 pg (ref 27.0–33.0)
MCHC: 34.1 g/dL (ref 32.0–36.0)
MCV: 84.4 fL (ref 80.0–100.0)
MONOS PCT: 8.2 %
MPV: 10.6 fL (ref 7.5–12.5)
NEUTROS ABS: 1408 {cells}/uL — AB (ref 1500–7800)
Neutrophils Relative %: 32 %
Platelets: 346 10*3/uL (ref 140–400)
RBC: 4.55 10*6/uL (ref 3.80–5.10)
RDW: 12.7 % (ref 11.0–15.0)
TOTAL LYMPHOCYTE: 57 %
WBC: 4.4 10*3/uL (ref 3.8–10.8)
WBCMIX: 361 {cells}/uL (ref 200–950)

## 2017-11-18 LAB — HIV ANTIBODY (ROUTINE TESTING W REFLEX): HIV 1&2 Ab, 4th Generation: NONREACTIVE

## 2017-11-18 LAB — RPR: RPR: NONREACTIVE

## 2017-11-19 LAB — C. TRACHOMATIS/N. GONORRHOEAE RNA
C. trachomatis RNA, TMA: NOT DETECTED
N. GONORRHOEAE RNA, TMA: NOT DETECTED

## 2017-11-21 ENCOUNTER — Telehealth: Payer: Self-pay

## 2017-11-21 NOTE — Telephone Encounter (Signed)
Called patient and advised her to call back to discuss lab work, and to make another appointment. Left call back number.

## 2017-12-01 ENCOUNTER — Telehealth: Payer: Self-pay

## 2017-12-01 NOTE — Telephone Encounter (Signed)
-----   Message from Avanell ShackletonVickie L Henson, NP-C sent at 11/20/2017  3:25 PM EST ----- Her blood work is fine except one of her liver enzymes is slightly elevated. She is negative for hepatitis. I recommend we recheck her liver function in 4 weeks. Have her schedule an office visit in 4 weeks to discuss this and recheck blood work.  She is negative for HIV, syphilis, gonorrhea and chlamydia.

## 2017-12-01 NOTE — Telephone Encounter (Signed)
Called and advised patient to call back to receive lab work, no answer. Had to leave VM

## 2017-12-16 ENCOUNTER — Ambulatory Visit: Payer: Self-pay | Admitting: Family Medicine

## 2018-01-04 ENCOUNTER — Ambulatory Visit (INDEPENDENT_AMBULATORY_CARE_PROVIDER_SITE_OTHER): Payer: 59 | Admitting: Family Medicine

## 2018-01-04 ENCOUNTER — Encounter: Payer: Self-pay | Admitting: Family Medicine

## 2018-01-04 VITALS — BP 120/80 | HR 85 | Wt 162.8 lb

## 2018-01-04 DIAGNOSIS — R740 Nonspecific elevation of levels of transaminase and lactic acid dehydrogenase [LDH]: Secondary | ICD-10-CM

## 2018-01-04 DIAGNOSIS — N926 Irregular menstruation, unspecified: Secondary | ICD-10-CM

## 2018-01-04 DIAGNOSIS — R7401 Elevation of levels of liver transaminase levels: Secondary | ICD-10-CM

## 2018-01-04 LAB — POCT URINE PREGNANCY: PREG TEST UR: NEGATIVE

## 2018-01-04 NOTE — Progress Notes (Signed)
   Subjective:    Patient ID: Grace [name], female    DOB: 1991/05/10, 27 y.o.   MRN: [id number]  HPI Chief Complaint  Patient presents with  . follow-up    follow-up   She is here to follow up on abnormal labs. Her ALT was mildly elevated at her last visit. Negative acute hepatitis panel.   She does admit to drinking alcohol heavily on the weekends.   No personal or family history of liver disease.   Reports having irregular periods for the past couple of months. States she had 2 periods this month and her first one was 5 days late. Reports recent unprotected sex.   Denies fever, chills, dizziness, chest pain, palpitations, shortness of breath, abdominal pain, N/V/D, urinary symptoms, vaginal discharge, LE edema.   Reviewed allergies, medications, past medical, surgical, family, and social history.    Review of Systems Pertinent positives and negatives in the history of present illness.     Objective:   Physical Exam BP 120/80   Pulse 85   Wt 162 lb 12.8 oz (73.8 kg)   BMI 27.94 kg/m   Alert and oriented and in no acute distress. Not otherwise examined.      Assessment & Plan:  Elevated ALT measurement - Plan: Hepatic Function Panel  Irregular menses - Plan: POCT urine pregnancy  Neg UPT.  Repeat hepatic function. Follow up.  Advised to stop alcohol.  She is aware that I may send her for an US if her liver enzymes are still elevated.

## 2018-01-05 LAB — HEPATIC FUNCTION PANEL
ALT: 27 IU/L (ref 0–32)
AST: 27 IU/L (ref 0–40)
Albumin: 4.4 g/dL (ref 3.5–5.5)
Alkaline Phosphatase: 54 IU/L (ref 39–117)
BILIRUBIN TOTAL: 0.3 mg/dL (ref 0.0–1.2)
BILIRUBIN, DIRECT: 0.09 mg/dL (ref 0.00–0.40)
Total Protein: 7.1 g/dL (ref 6.0–8.5)

## 2018-05-26 ENCOUNTER — Institutional Professional Consult (permissible substitution): Payer: 59 | Admitting: Family Medicine

## 2018-06-09 ENCOUNTER — Encounter: Payer: Self-pay | Admitting: Family Medicine

## 2018-07-24 ENCOUNTER — Ambulatory Visit (INDEPENDENT_AMBULATORY_CARE_PROVIDER_SITE_OTHER): Payer: 59 | Admitting: Medical

## 2018-07-24 VITALS — BP 110/70 | HR 80 | Temp 98.3°F | Resp 16 | Ht 64.5 in | Wt 168.2 lb

## 2018-07-24 DIAGNOSIS — L723 Sebaceous cyst: Secondary | ICD-10-CM | POA: Insufficient documentation

## 2018-07-24 DIAGNOSIS — L989 Disorder of the skin and subcutaneous tissue, unspecified: Secondary | ICD-10-CM | POA: Insufficient documentation

## 2018-07-24 NOTE — Progress Notes (Signed)
Subjective: Chief Complaint  Patient presents with  . boil    boil left leg X 2 months   Here for lump/growth on left lower leg x 2 months.  Its itchy at times, but not painful, no redness, no warmth.   It doesn't change in size.  No hx/o abscess or cyst.   No family hx/o similar, no family hx/o keloids.  otherwise in usual state of health without fever or other symptoms.  No other aggravating or relieving factors. No other complaint.   Current Outpatient Medications on File Prior to Visit  Medication Sig Dispense Refill  . [DISCONTINUED] medroxyPROGESTERone (DEPO-PROVERA) 150 MG/ML injection Inject 150 mg into the muscle every 3 (three) months.      No current facility-administered medications on file prior to visit.    ROS as in subjective   Objective: BP 110/70   Pulse 80   Temp 98.3 F (36.8 C) (Oral)   Resp 16   Ht 5' 4.5" (1.638 m)   Wt 168 lb 3.2 oz (76.3 kg)   SpO2 98%   BMI 28.43 kg/m   Gen: wd, wn, nad Skin: left lower leg anterolateral proximal 1/3 of leg with 1cm diameter raised lesion, no induration, no fluctuance, no warmth.  No unusual color other than brown Leg otherwise neurovascularly intact    Assessment: Encounter Diagnosis  Name Primary?  . Skin lesion of left leg Yes     Plan: Discussed examination findings, diagnosis, usual course of illness, and options for therapy discussed.   Dr. Susann GivensLalonde supervising physical also examined her leg and suspected sebaceous cyst.  After discussing recommendations, patient agrees to I&D.    Procedure Informed consent obtained.  The area was prepped in the usual manner and the skin overlying the cyst was anesthetized with 3cc of 1% lidocaine with epinephrine.  A small incision was made and approx 5ccs of cheesy thick sebum was obtained.  Area was irrigated with high pressure saline. Packing was not inserted. Wound was covered with sterile bandage and triple antibiotic ointment.    Recommendations  Take a hot  soapy bath for 20 minutes the next few days to clean the wound  Use topical Neosporin and bandage daily for 3-5 days  Once the wound is closed in 4-5 days, you can use Cocoa Butter or Shea butter daily for another 1-2 weeks  If any signs of worsening redness or pain, recheck  Follow up: prn.  However, if worse signs of infections as discussed (fever, chills, nausea, vomiting, worsening redness, worsening pain), then call or return immediately.   [name] was seen today for boil.  Diagnoses and all orders for this visit:  Skin lesion of left leg

## 2018-07-24 NOTE — Patient Instructions (Addendum)
Recommendations  Take a hot soapy bath for 20 minutes the next few days to clean the wound  Use topical Neosporin and bandage daily for 3-5 days  Once the wound is closed in 4-5 days, you can use Cocoa Butter or Shea butter daily for another 1-2 weeks  If any signs of worsening redness or pain, recheck   Epidermal Cyst An epidermal cyst is a small, painless lump under your skin. It may be called an epidermal inclusion cyst or an infundibular cyst. The cyst contains a grayish-white, bad-smelling substance (keratin). It is important not to pop epidermal cysts yourself. These cysts are usually harmless (benign), but they can get infected. Symptoms of infection may include:  Redness.  Inflammation.  Tenderness.  Warmth.  Fever.  A grayish-white, bad-smelling substance draining from the cyst.  Pus draining from the cyst.  Follow these instructions at home:  Take over-the-counter and prescription medicines only as told by your doctor.  If you were prescribed an antibiotic, use it as told by your doctor. Do not stop using the antibiotic even if you start to feel better.  Keep the area around your cyst clean and dry.  Wear loose, dry clothing.  Do not try to pop your cyst.  Avoid touching your cyst.  Check your cyst every day for signs of infection.  Keep all follow-up visits as told by your doctor. This is important. How is this prevented?  Wear clean, dry, clothing.  Avoid wearing tight clothing.  Keep your skin clean and dry. Shower or take baths every day.  Wash your body with a benzoyl peroxide wash when you shower or bathe. Contact a health care provider if:  Your cyst has symptoms of infection.  Your condition is not improving or is getting worse.  You have a cyst that looks different from other cysts you have had.  You have a fever. Get help right away if:  Redness spreads from the cyst into the surrounding area. This information is not intended to  replace advice given to you by your health care provider. Make sure you discuss any questions you have with your health care provider. Document Released: 12/30/2004 Document Revised: 07/21/2016 Document Reviewed: 09/24/2015 Elsevier Interactive Patient Education  [employer]2018 Elsevier Inc.

## 2019-02-08 ENCOUNTER — Telehealth: Payer: Self-pay | Admitting: Family Medicine

## 2019-02-08 NOTE — Telephone Encounter (Signed)
Dismissal letter in guarantor snapshot  °

## 2019-08-20 ENCOUNTER — Other Ambulatory Visit: Payer: Self-pay | Admitting: *Deleted

## 2019-08-20 DIAGNOSIS — Z20822 Contact with and (suspected) exposure to covid-19: Secondary | ICD-10-CM

## 2019-08-21 LAB — NOVEL CORONAVIRUS, NAA: SARS-CoV-2, NAA: NOT DETECTED

## 2019-11-27 ENCOUNTER — Ambulatory Visit: Payer: 59 | Attending: Internal Medicine

## 2019-11-27 DIAGNOSIS — Z20822 Contact with and (suspected) exposure to covid-19: Secondary | ICD-10-CM

## 2019-11-28 ENCOUNTER — Other Ambulatory Visit: Payer: 59

## 2019-11-29 LAB — NOVEL CORONAVIRUS, NAA: SARS-CoV-2, NAA: NOT DETECTED

## 2019-11-30 ENCOUNTER — Inpatient Hospital Stay (HOSPITAL_COMMUNITY)
Admission: AD | Admit: 2019-11-30 | Discharge: 2019-12-01 | Disposition: A | Discharge status: Home / Self Care | Payer: 59 | Attending: Obstetrics and Gynecology | Admitting: Obstetrics and Gynecology

## 2019-11-30 ENCOUNTER — Inpatient Hospital Stay (HOSPITAL_COMMUNITY): Payer: 59

## 2019-11-30 ENCOUNTER — Encounter (HOSPITAL_COMMUNITY): Payer: Self-pay | Admitting: Obstetrics and Gynecology

## 2019-11-30 ENCOUNTER — Other Ambulatory Visit: Payer: Self-pay

## 2019-11-30 DIAGNOSIS — O200 Threatened abortion: Secondary | ICD-10-CM

## 2019-11-30 DIAGNOSIS — Z3A09 9 weeks gestation of pregnancy: Secondary | ICD-10-CM

## 2019-11-30 DIAGNOSIS — O209 Hemorrhage in early pregnancy, unspecified: Secondary | ICD-10-CM | POA: Diagnosis present

## 2019-11-30 DIAGNOSIS — Z87891 Personal history of nicotine dependence: Secondary | ICD-10-CM | POA: Insufficient documentation

## 2019-11-30 HISTORY — DX: Other specified health status: Z78.9

## 2019-11-30 LAB — CBC
HCT: 35.1 % — ABNORMAL LOW (ref 36.0–46.0)
Hemoglobin: 12.3 g/dL (ref 12.0–15.0)
MCH: 29.4 pg (ref 26.0–34.0)
MCHC: 35 g/dL (ref 30.0–36.0)
MCV: 84 fL (ref 80.0–100.0)
Platelets: 289 10*3/uL (ref 150–400)
RBC: 4.18 MIL/uL (ref 3.87–5.11)
RDW: 12.4 % (ref 11.5–15.5)
WBC: 7.3 10*3/uL (ref 4.0–10.5)
nRBC: 0 % (ref 0.0–0.2)

## 2019-11-30 LAB — WET PREP, GENITAL
Clue Cells Wet Prep HPF POC: NONE SEEN
Sperm: NONE SEEN
Trich, Wet Prep: NONE SEEN

## 2019-11-30 LAB — ABO/RH
ABO/RH(D): A NEG
Antibody Screen: NEGATIVE

## 2019-11-30 LAB — HCG, QUANTITATIVE, PREGNANCY: hCG, Beta Chain, Quant, S: 82215 m[IU]/mL — ABNORMAL HIGH (ref <5)

## 2019-11-30 LAB — URINALYSIS, ROUTINE W REFLEX MICROSCOPIC
Bacteria, UA: NONE SEEN
Bilirubin Urine: NEGATIVE
Glucose, UA: NEGATIVE mg/dL
Ketones, ur: 5 mg/dL — AB
Leukocytes,Ua: NEGATIVE
Nitrite: NEGATIVE
Protein, ur: 100 mg/dL — AB
RBC / HPF: >50 RBC/hpf — ABNORMAL HIGH (ref 0–5)
Specific Gravity, Urine: 1.039 — ABNORMAL HIGH (ref 1.005–1.030)
pH: 5 (ref 5.0–8.0)

## 2019-11-30 NOTE — MAU Note (Signed)
Pt reports to MAU c/o heavy vaginal bleeding that started 1hour ago pt reports passing golf ball sized clots that were red. Pt is currently wearing 4pads. Pt had slight cramping associated with the bleeding.

## 2019-11-30 NOTE — MAU Provider Note (Addendum)
History     CSN: [id number]  Arrival date and time: 11/30/19 2108   First Provider Initiated Contact with Patient 11/30/19 2140      Chief Complaint  Patient presents with  . Vaginal Bleeding  . Abdominal Pain   HPI  Grace [name] is a 28 yo G1P0 at [redacted]w[redacted]d EGA by LMP (patient of Dr. Ivor [name], confirmed pregnancy in office on 11/07/2019) who is presenting to MAU complaining of heavy vaginal bleeding that occurred about an hour before arrival to MAU. She said that she went to the bathroom and sat on the toilet for 20-30 minutes, passing golf-ball sized clots that were red in color. She also had mild-moderate abdominal cramping at this time.  She denies nausea, vomiting, diarrhea, constipation, blood in stool, dysuria, hematuria, or vaginal discharge.  OB History    Gravida  1   Para      Term      Preterm      AB      Living        SAB      TAB      Ectopic      Multiple      Live Births              Past Medical History:  Diagnosis Date  . Bacterial vaginosis   . Chlamydia   . Medical history non-contributory   . Tooth ache     Past Surgical History:  Procedure Laterality Date  . NO PAST SURGERIES      Family History  Problem Relation Age of Onset  . Arthritis Maternal Grandmother   . Heart disease Maternal Grandmother   . Stroke Maternal Grandmother   . Hypertension Maternal Grandmother   . Allergies Mother   . Allergies Brother     Social History   Tobacco Use  . Smoking status: Former Smoker    Quit date: 10/28/2011    Years since quitting: 8.0  . Smokeless tobacco: Never Used  . Tobacco comment: pt notes that she quit about 1 month ago  Substance Use Topics  . Alcohol use: Not Currently    Comment: twice a month  . Drug use: No    Allergies: No Known Allergies  Medications Prior to Admission  Medication Sig Dispense Refill Last Dose  . Prenatal Vit-Fe Fumarate-FA (PRENATAL MULTIVITAMIN) TABS tablet Take 1 tablet by mouth daily at 12  noon.       Review of Systems  All other systems reviewed and are negative.  Physical Exam   Blood pressure 138/82, pulse 90, temperature 99 F (37.2 C), temperature source Oral, resp. rate 18, weight 77.6 kg, last menstrual period 09/26/2019.  Physical Exam  Nursing note and vitals reviewed. Constitutional: She is oriented to person, place, and time. She appears well-developed and well-nourished.  HENT:  Head: Normocephalic and atraumatic.  Eyes: Pupils are equal, round, and reactive to light. Conjunctivae and EOM are normal.  Cardiovascular: Normal rate, regular rhythm and normal heart sounds.  Respiratory: Effort normal and breath sounds normal.  GI: Soft. Bowel sounds are normal. She exhibits no distension and no mass. There is no abdominal tenderness. There is no rebound and no guarding.  Genitourinary:    No vaginal discharge.     Genitourinary Comments: Moderate amount of maroon-colored blood in the vaginal vault, no pooling. Nulliparous cervical Os closed   Musculoskeletal:        General: Normal range of motion.     Cervical back: Normal range  of motion and neck supple.  Neurological: She is alert and oriented to person, place, and time. She has normal reflexes.  Skin: Skin is warm and dry.  Psychiatric: She has a normal mood and affect. Her behavior is normal. Judgment and thought content normal.    MAU Course  Procedures  MDM -R/O miscarriage based on physical exam and history -HCG quant, CBC, type and screen -Vaginal swabs collected: wet prep and GC/CT -Transvaginal US to assess for IUP  Results for orders placed or performed during the hospital encounter of 11/30/19 (from the past 24 hour(s))  Urinalysis, Routine w reflex microscopic     Status: Abnormal   Collection Time: 11/30/19  9:14 PM  Result Value Ref Range   Color, Urine AMBER (A) YELLOW   APPearance CLOUDY (A) CLEAR   Specific Gravity, Urine 1.039 (H) 1.005 - 1.030   pH 5.0 5.0 - 8.0   Glucose, UA  NEGATIVE NEGATIVE mg/dL   Hgb urine dipstick LARGE (A) NEGATIVE   Bilirubin Urine NEGATIVE NEGATIVE   Ketones, ur 5 (A) NEGATIVE mg/dL   Protein, ur 621100 (A) NEGATIVE mg/dL   Nitrite NEGATIVE NEGATIVE   Leukocytes,Ua NEGATIVE NEGATIVE   RBC / HPF >50 (H) 0 - 5 RBC/hpf   Bacteria, UA NONE SEEN NONE SEEN   Squamous Epithelial / LPF 0-5 0 - 5   Mucus PRESENT   Wet prep, genital     Status: Abnormal   Collection Time: 11/30/19  9:58 PM   Specimen: Vaginal  Result Value Ref Range   Yeast Wet Prep HPF POC PRESENT (A) NONE SEEN   Trich, Wet Prep NONE SEEN NONE SEEN   Clue Cells Wet Prep HPF POC NONE SEEN NONE SEEN   WBC, Wet Prep HPF POC FEW (A) NONE SEEN   Sperm NONE SEEN   hCG, quantitative, pregnancy     Status: Abnormal   Collection Time: 11/30/19 10:06 PM  Result Value Ref Range   hCG, Beta Chain, Quant, S 82,215 (H) <5 mIU/mL  CBC     Status: Abnormal   Collection Time: 11/30/19 10:06 PM  Result Value Ref Range   WBC 7.3 4.0 - 10.5 K/uL   RBC 4.18 3.87 - 5.11 MIL/uL   Hemoglobin 12.3 12.0 - 15.0 g/dL   HCT 30.835.1 (L) 65.736.0 - 84.646.0 %   MCV 84.0 80.0 - 100.0 fL   MCH 29.4 26.0 - 34.0 pg   MCHC 35.0 30.0 - 36.0 g/dL   RDW 96.212.4 95.211.5 - 84.115.5 %   Platelets 289 150 - 400 K/uL   nRBC 0.0 0.0 - 0.2 %  ABO/Rh     Status: None (Preliminary result)   Collection Time: 11/30/19 10:06 PM  Result Value Ref Range   ABO/RH(D) A NEG    Antibody Screen PENDING   Rh IG workup (includes ABO/Rh)     Status: None (Preliminary result)   Collection Time: 11/30/19 10:06 PM  Result Value Ref Range   Gestational Age(Wks) 9    ABO/RH(D) A NEG    Antibody Screen PENDING    US preliminary results -SIUP at 9 weeks 2 days with heart rate 164 -Large subchorionic hemorrhage  Assessment and Plan  28 yo G1P0 at 5834w2d EGA with vaginal bleeding -Most likely threatened abortion -Recommend following up with Dr. Shella SpearingBanga's office next week -Return precautions discussed -Patient counseled that due to Rh negative  blood she will receive Rhogam as a precaution -DC to home in stable condition  Grace [name] 11/30/2019,  10:36 PM

## 2019-12-01 MED ORDER — RHO D IMMUNE GLOBULIN 1500 UNIT/2ML IJ SOSY
300.0000 ug | PREFILLED_SYRINGE | Freq: Once | INTRAMUSCULAR | Status: AC
  Administered 2019-12-01: 300 ug via INTRAMUSCULAR
  Filled 2019-12-01: qty 2

## 2019-12-01 NOTE — Discharge Instructions (Signed)
Threatened Miscarriage ° °A threatened miscarriage is when you have bleeding from your vagina during the first 20 weeks of pregnancy but the pregnancy does not end. Your doctor will do tests to make sure you are still pregnant. The cause of the bleeding may not be known. This condition does not mean your pregnancy will end, but it does increase the risk that it will end (complete miscarriage). °Follow these instructions at home: °· Get plenty of rest. °· If you have bleeding in your vagina, do not have sex or use tampons. °· Do not douche. °· Do not smoke or use drugs. °· Do not drink alcohol. °· Avoid caffeine. °· Keep all follow-up prenatal visits as told by your doctor. This is important. °Contact a doctor if: °· You have light bleeding from your vagina. °· You have belly pain or cramping. °· You have a fever. °Get help right away if: °· You have heavy bleeding from your vagina. °· You have clots of blood coming from your vagina. °· You pass tissue from your vagina. °· You have a gush of fluid from your vagina. °· You are leaking fluid from your vagina. °· You have very bad pain or cramps in your low back or belly. °· You have fever, chills, and very bad belly pain. °Summary °· A threatened miscarriage is when you have bleeding from your vagina during the first 20 weeks of pregnancy but the pregnancy does not end. °· This condition does not mean your pregnancy will end, but it does increase the risk that it will end (complete miscarriage). °· Get plenty of rest. If you have bleeding in your vagina, do not have sex or use tampons. °· Keep all follow-up prenatal visits as told by your doctor. This is important. °This information is not intended to replace advice given to you by your health care provider. Make sure you discuss any questions you have with your health care provider. °Document Released: 11/04/2008 Document Revised: 12/29/2017 Document Reviewed: 02/18/2017 °Elsevier Patient Education © 2020 Elsevier  Inc. ° °

## 2019-12-02 LAB — RH IG WORKUP (INCLUDES ABO/RH)
ABO/RH(D): A NEG
Antibody Screen: NEGATIVE
Gestational Age(Wks): 9
Unit division: 0

## 2019-12-02 LAB — CULTURE, OB URINE: Culture: NO GROWTH

## 2019-12-03 LAB — GC/CHLAMYDIA PROBE AMP (~~LOC~~) NOT AT ARMC
Chlamydia: NEGATIVE
Comment: NEGATIVE
Comment: NORMAL
Neisseria Gonorrhea: NEGATIVE

## 2020-08-30 IMAGING — US US OB COMP LESS 14 WK
1 series · 15 of 26 positions shown · non-contrast
Comparison: None.

CLINICAL DATA: 28-year-old pregnant female with vaginal bleeding.
LMP: 09/26/2019 corresponding to an estimated gestational age of 9
weeks, 2 days.

EXAM:
OBSTETRIC <14 WK ULTRASOUND
TECHNIQUE: Transabdominal ultrasound was performed for evaluation of the
gestation as well as the maternal uterus and adnexal regions.

[Series 1: us ob comp less 14 wk · 26 acquisitions, 15 frames shown]
[im 1/26]
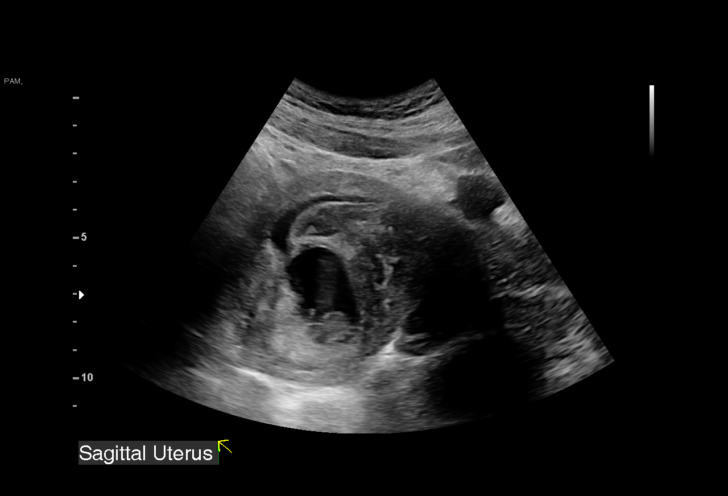
[im 3/26]
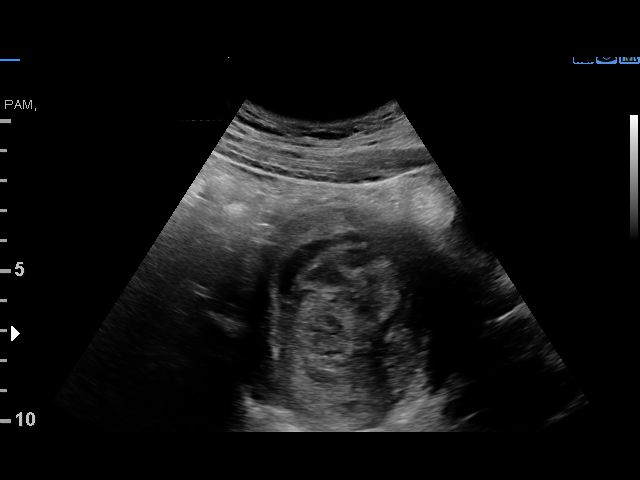
[im 5/26]
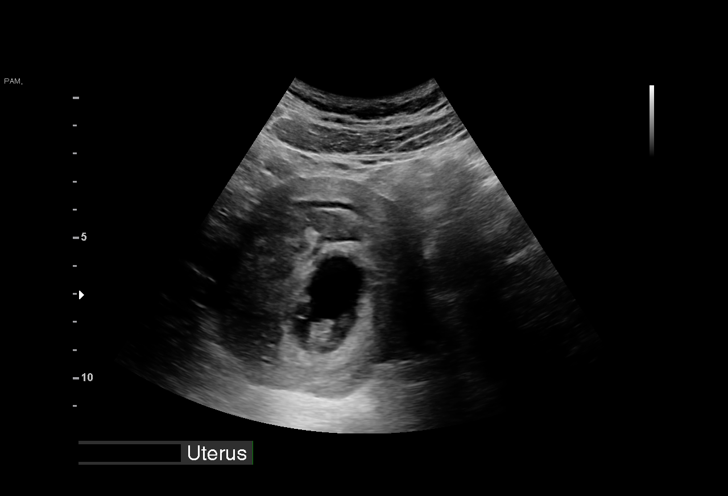
[im 7/26]
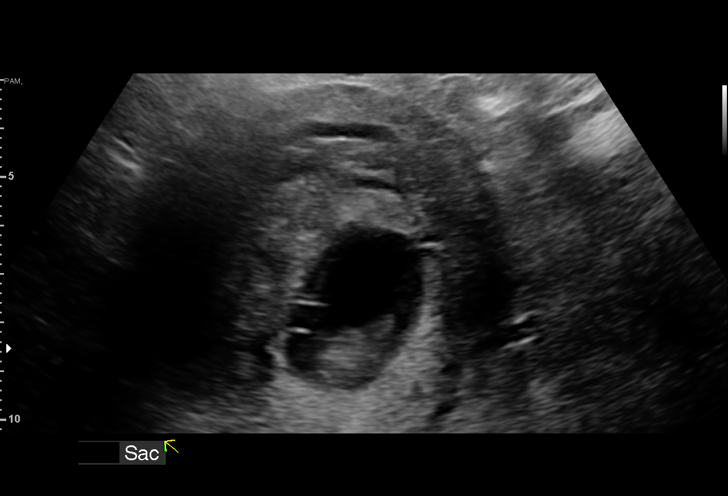
[im 8/26]
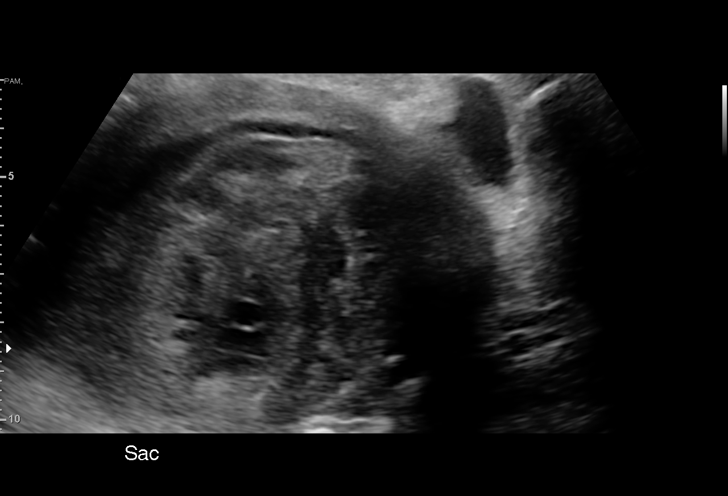
[im 10/26]
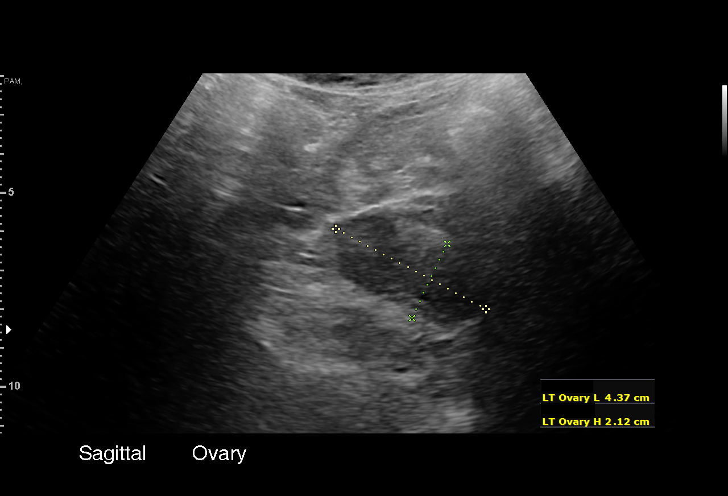
[im 12/26]
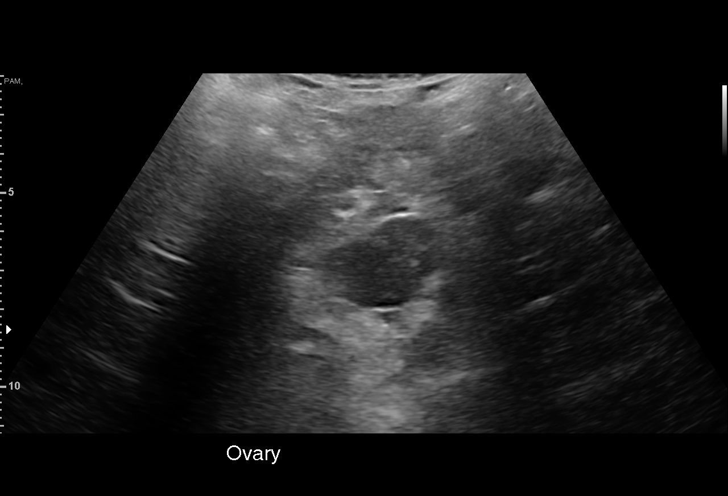
[im 14/26]
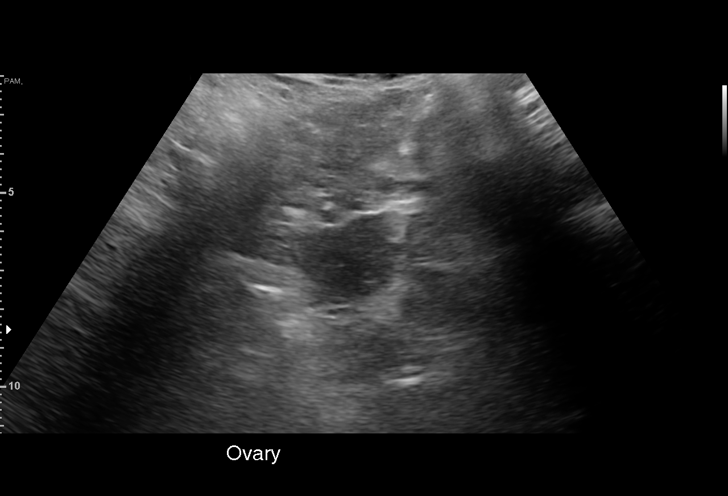
[im 15/26]
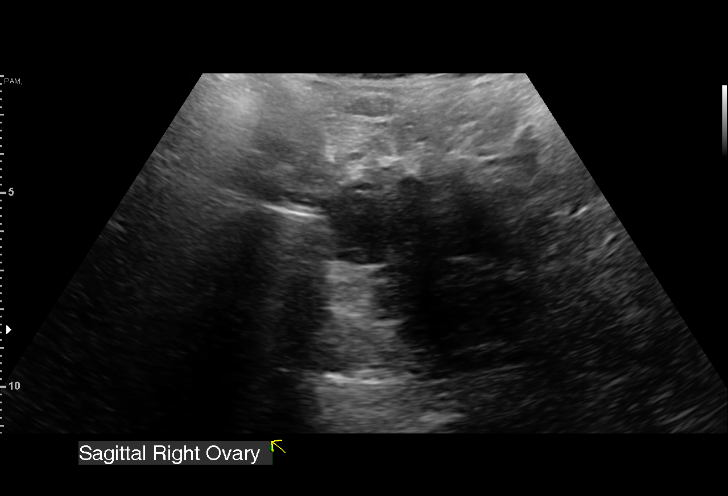
[im 17/26]
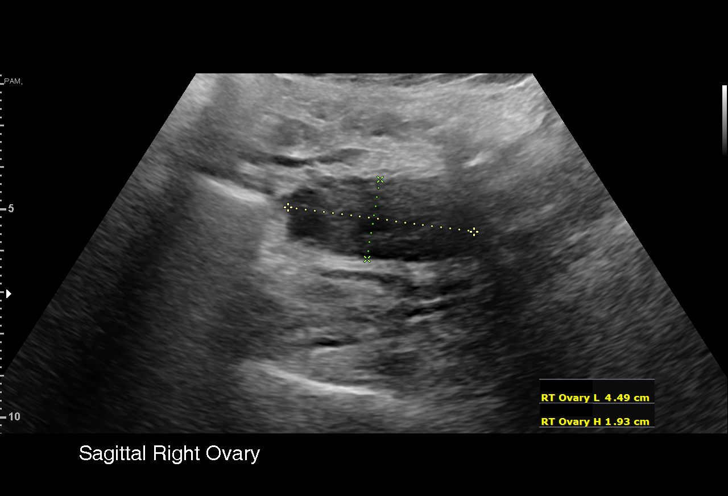
[im 19/26]
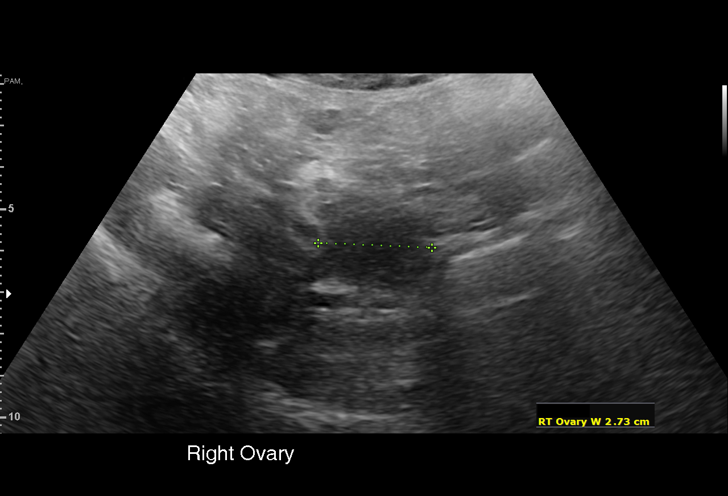
[im 20/26]
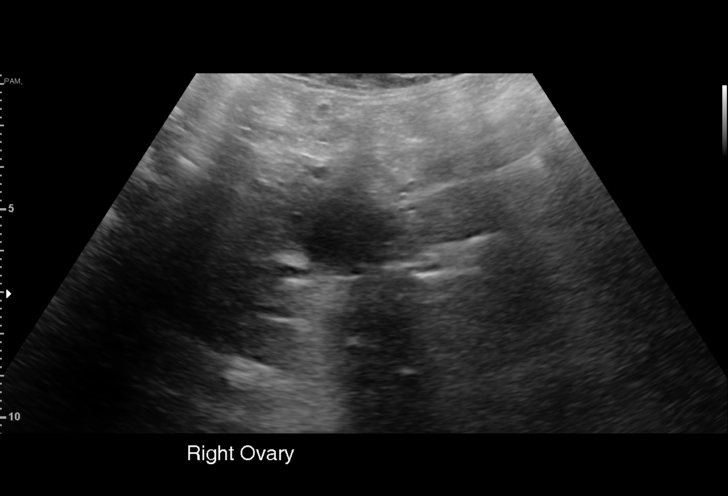
[im 22/26]
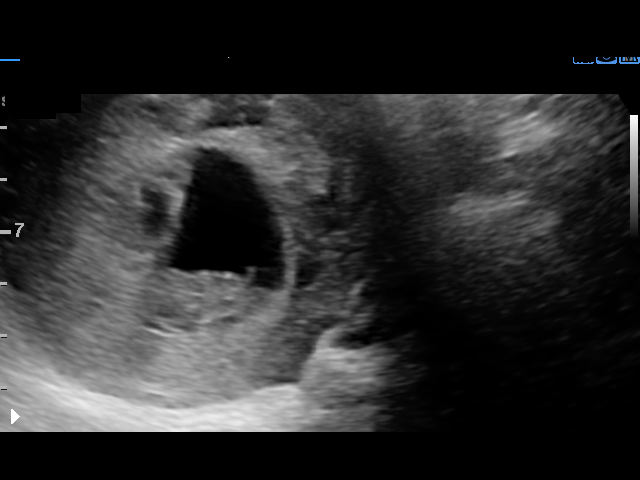
[im 24/26]
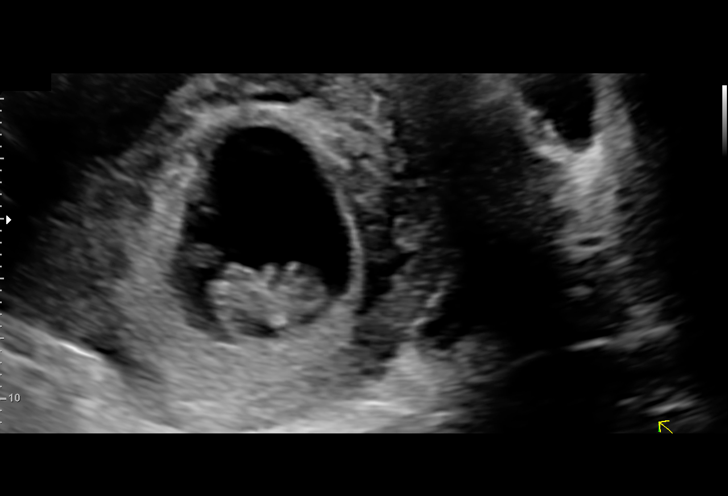
[im 26/26]
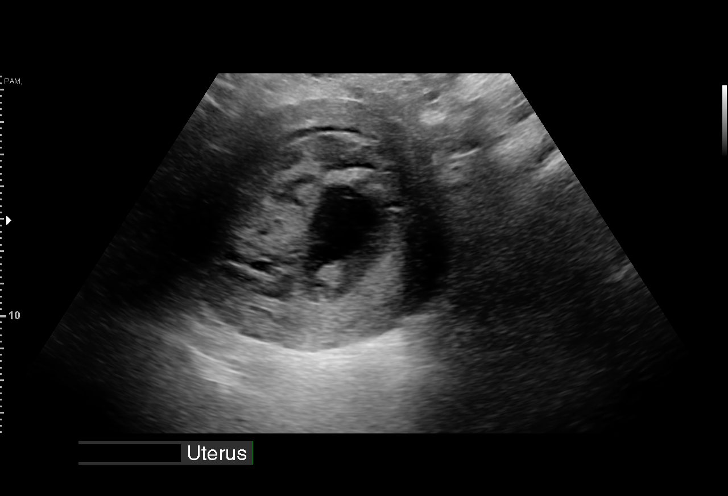

[15 of 26 positions shown; findings below may reference images not displayed]

FINDINGS: Intrauterine gestational sac: Single intrauterine gestational sac.

Yolk sac:  Seen

Embryo:  Present

Cardiac Activity: Detected

Heart Rate: 164 bpm

CRL: 26 mm   9 w 2 d                  US EDC: 07/02/2020

Subchorionic hemorrhage: There is a large perigestational hemorrhage
measuring 4.5 x 3.1 cm and encompassing at least 50% of the
circumference of the gestational sac.

Maternal uterus/adnexae: The maternal ovaries are unremarkable.

No free fluid within the pelvis.
IMPRESSION: 1. Single live intrauterine pregnancy with an estimated gestational
age of 9 weeks, 2 days based on crown-rump length concordant with
age based on LMP.
2. Large perigestational hemorrhage. Follow-up as clinically
indicated.

## 2020-10-15 ENCOUNTER — Ambulatory Visit: Payer: Self-pay | Admitting: Obstetrics and Gynecology

## 2020-10-15 DIAGNOSIS — Z0289 Encounter for other administrative examinations: Secondary | ICD-10-CM

## 2020-11-11 LAB — OB RESULTS CONSOLE RPR: RPR: NONREACTIVE

## 2020-11-11 LAB — OB RESULTS CONSOLE HEPATITIS B SURFACE ANTIGEN: Hepatitis B Surface Ag: NEGATIVE

## 2020-11-11 LAB — OB RESULTS CONSOLE RUBELLA ANTIBODY, IGM: Rubella: IMMUNE

## 2020-11-11 LAB — OB RESULTS CONSOLE HIV ANTIBODY (ROUTINE TESTING): HIV: NONREACTIVE

## 2020-11-17 LAB — OB RESULTS CONSOLE GC/CHLAMYDIA
Chlamydia: NEGATIVE
Gonorrhea: NEGATIVE

## 2020-12-03 ENCOUNTER — Other Ambulatory Visit: Payer: Self-pay

## 2020-12-03 ENCOUNTER — Encounter: Payer: 59 | Attending: Obstetrics and Gynecology | Admitting: Registered"

## 2020-12-03 ENCOUNTER — Encounter: Payer: Self-pay | Admitting: Registered"

## 2020-12-03 DIAGNOSIS — O24419 Gestational diabetes mellitus in pregnancy, unspecified control: Secondary | ICD-10-CM

## 2020-12-03 NOTE — Progress Notes (Signed)
Patient was seen on 12/03/20 for Gestational Diabetes self-management class at the [facility]. The following learning objectives were met by the patient during this course:   States the definition of Gestational Diabetes  States why dietary management is important in controlling blood glucose  Describes the effects each nutrient has on blood glucose levels  Demonstrates ability to create a balanced meal plan  Demonstrates carbohydrate counting   States when to check blood glucose levels  Demonstrates proper blood glucose monitoring techniques  States the effect of stress and exercise on blood glucose levels  States the importance of limiting caffeine and abstaining from alcohol and smoking  Blood glucose monitor given: Roma Schanz  Lot# [id number] x Exp: 07/05/21 Blood Glucose: 123 mg/dL  Patient instructed to monitor glucose levels: FBS: 60 - <95; 1 hour: <140; 2 hour: <120  Patient received handouts:  Nutrition Diabetes and Pregnancy, including carb counting list  Patient will be seen for follow-up as needed.

## 2020-12-06 NOTE — L&D Delivery Note (Addendum)
Delivery Note Labor onset: 06/17/21  Labor Onset Time: 0900 Complete dilation at 7:21 PM  Onset of pushing at 1921 FHR second stage Cat 2 Analgesia/Anesthesia intrapartum: epidural  Guided pushing with maternal urge. Delivery of a viable female at 75. Fetal head delivered in LOA position.  Nuchal cord: N/A.  Infant placed on maternal abd, dried, and tactile stim. Spontaneous cry at delivery Cord double clamped after pulsation ceased and cut by FOB.  2 Rns present for birth.  Cord blood sample collected: yes Arterial cord blood sample collected: N/A  Placenta delivered Tomasa Blase via Binnie Kand Maneuver, intact, with 3 VC.  AMTSL Placenta to L&D. Uterine tone firm, bleeding minimal  Vaginal (repaired) and bilateral periurtheral laceration( hemostatic)  identified.  Anesthesia: epidural Repair: 3-0 vicryl QBL/EBL (mL): 200 Complications: N/A APGAR: APGAR (1 MIN):   APGAR (5 MINS):   APGAR (10 MINS):   Mom to postpartum.  Baby to Couplet care / Skin to Skin. Dr Connye Burkitt aware of pt status and POC.  Gerhard Munch Dhruva Orndoff MSN, CNM 06/17/2021, 8:14 PM

## 2021-05-26 LAB — OB RESULTS CONSOLE GBS: GBS: NEGATIVE

## 2021-06-09 ENCOUNTER — Other Ambulatory Visit: Payer: Self-pay | Admitting: Obstetrics and Gynecology

## 2021-06-10 ENCOUNTER — Telehealth (HOSPITAL_COMMUNITY): Payer: Self-pay | Admitting: *Deleted

## 2021-06-10 ENCOUNTER — Encounter (HOSPITAL_COMMUNITY): Payer: Self-pay | Admitting: *Deleted

## 2021-06-10 NOTE — Telephone Encounter (Signed)
Preadmission screen  

## 2021-06-15 ENCOUNTER — Other Ambulatory Visit (HOSPITAL_COMMUNITY)
Admission: RE | Admit: 2021-06-15 | Discharge: 2021-06-15 | Disposition: A | Discharge status: Home / Self Care | Payer: 59 | Source: Ambulatory Visit | Attending: Obstetrics and Gynecology | Admitting: Obstetrics and Gynecology

## 2021-06-15 DIAGNOSIS — Z01812 Encounter for preprocedural laboratory examination: Secondary | ICD-10-CM | POA: Insufficient documentation

## 2021-06-15 DIAGNOSIS — Z20822 Contact with and (suspected) exposure to covid-19: Secondary | ICD-10-CM | POA: Insufficient documentation

## 2021-06-15 LAB — SARS CORONAVIRUS 2 (TAT 6-24 HRS): SARS Coronavirus 2: NEGATIVE

## 2021-06-16 DIAGNOSIS — Z6791 Unspecified blood type, Rh negative: Secondary | ICD-10-CM

## 2021-06-16 DIAGNOSIS — O26899 Other specified pregnancy related conditions, unspecified trimester: Secondary | ICD-10-CM

## 2021-06-16 NOTE — H&P (Signed)
OB ADMISSION/ HISTORY & PHYSICAL:  Admission Date: No admission date for patient encounter.  Admit Diagnosis: IOL for A1GDM  JOELEE [name] is a 30 y.o. female G3P0010 [redacted]w[redacted]d presenting for [place] for GDM. Endorses active FM, denies LOF and vaginal bleeding. GDM dx @ 11 wks, suspect Type 2 DM because dx was made in first trimester.   History of current pregnancy: G3P0010   Patient entered care with [facility] at 8+6 wks.   EDC 06/17/21 by LMP and congruent w/ 8+6 wk U/S.   Anatomy scan:  21+6 wks, complete w/ posterior placenta.   Antenatal testing: for GDM started at 32 weeks Last evaluation: 39+6 wks vertex/ posterior placenta/ AFI 14.6  Last EFW @ 34+6 wks 6# 3 oz (78%)    Significant prenatal events:  Patient Active Problem List   Diagnosis Date Noted   Rh negative, maternal 06/16/2021   Gestational diabetes mellitus (GDM), antepartum 12/03/2020    Prenatal Labs: ABO, Rh:   Antibody:   Rubella:   immune RPR:   NR HBsAg:   nR HIV:   NR GTT: failed 3 hr GBS:   negative GC/CHL: neg/neg Genetics: declined Tdap/influenza vaccines: declined both   OB History  Gravida Para Term Preterm AB Living  3       1    SAB IAB Ectopic Multiple Live Births  1            # Outcome Date GA Lbr Len/2nd Weight Sex Delivery Anes PTL Lv  3 Current           2 SAB           1 Gravida             Medical / Surgical History: Past medical history:  Past Medical History:  Diagnosis Date   Bacterial vaginosis    Chlamydia    Gestational diabetes    Medical history non-contributory    Tooth ache     Past surgical history:  Past Surgical History:  Procedure Laterality Date   NO PAST SURGERIES     Family History:  Family History  Problem Relation Age of Onset   Arthritis Maternal Grandmother    Heart disease Maternal Grandmother    Stroke Maternal Grandmother    Hypertension Maternal Grandmother    Allergies Mother    Allergies Brother     Social History:  reports that she quit  smoking about 9 years ago. Her smoking use included cigarettes. She has never used smokeless tobacco. She reports previous alcohol use. She reports that she does not use drugs.  Allergies: Patient has no known allergies.   Current Medications at time of admission:  Prior to Admission medications   Medication Sig Start Date End Date Taking? Authorizing Provider  Prenatal Vit-Fe Fumarate-FA (PRENATAL MULTIVITAMIN) TABS tablet Take 1 tablet by mouth daily at 12 noon.    [provider]  medroxyPROGESTERone (DEPO-PROVERA) 150 MG/ML injection Inject 150 mg into the muscle every 3 (three) months.  10/11/11 07/08/12  [provider]    Review of Systems: Constitutional: Negative   HENT: Negative   Eyes: Negative   Respiratory: Negative   Cardiovascular: Negative   Gastrointestinal: Negative  Genitourinary: pos for bloody show, neg for LOF   Musculoskeletal: Negative   Skin: Negative   Neurological: Negative   Endo/Heme/Allergies: Negative   Psychiatric/Behavioral: Negative    Physical Exam: VS: unknown if currently breastfeeding. AAO x3, no signs of distress Cardiovascular: RRR Respiratory: Lung fields clear  to ausculation GU/GI: Abdomen gravid, non-tender, non-distended, active FM, vertex, EFW 7-8# per Leopold's Extremities: no edema, negative for pain, tenderness, and cords  Cervical exam:Dilation: 3 Effacement (%): 90 Station: 0 Exam by:: Rhea Pink, CNM FHR: baseline rate 130 / variability moderate / accelerations present / absent decelerations TOCO: irreg   Prenatal Transfer Tool  Maternal Diabetes: Yes:  Diabetes Type:  Diet controlled Genetic Screening: Declined Maternal Ultrasounds/Referrals: Normal Fetal Ultrasounds or other Referrals:  None Maternal Substance Abuse:  No Significant Maternal Medications:  None Significant Maternal Lab Results: Group B Strep negative    Assessment: 30 y.o. G3P0010 [redacted]w[redacted]d IOL for GDM   FHR category 1 GBS  neg Pain management plan: Epidural   Plan:  Admit to L&D Routine admission orders Epidural PRN CBG Q 4 hrs Pitocin augmentation  Dr Su Hilt notified of admission and plan of care  Grace Schanz MSN, CNM 06/16/2021 8:46 PM

## 2021-06-17 ENCOUNTER — Inpatient Hospital Stay (HOSPITAL_COMMUNITY): Payer: 59 | Admitting: Anesthesiology

## 2021-06-17 ENCOUNTER — Inpatient Hospital Stay (HOSPITAL_COMMUNITY): Payer: 59

## 2021-06-17 ENCOUNTER — Encounter (HOSPITAL_COMMUNITY): Payer: Self-pay | Admitting: Obstetrics and Gynecology

## 2021-06-17 ENCOUNTER — Inpatient Hospital Stay (HOSPITAL_COMMUNITY)
Admission: AD | Admit: 2021-06-17 | Discharge: 2021-06-19 | DRG: 806 | Disposition: A | Discharge status: Home / Self Care | Payer: 59 | Attending: Obstetrics and Gynecology | Admitting: Obstetrics and Gynecology

## 2021-06-17 DIAGNOSIS — Z20822 Contact with and (suspected) exposure to covid-19: Secondary | ICD-10-CM | POA: Diagnosis present

## 2021-06-17 DIAGNOSIS — Z87891 Personal history of nicotine dependence: Secondary | ICD-10-CM | POA: Diagnosis not present

## 2021-06-17 DIAGNOSIS — Z3A39 39 weeks gestation of pregnancy: Secondary | ICD-10-CM

## 2021-06-17 DIAGNOSIS — D62 Acute posthemorrhagic anemia: Secondary | ICD-10-CM | POA: Diagnosis not present

## 2021-06-17 DIAGNOSIS — O26899 Other specified pregnancy related conditions, unspecified trimester: Secondary | ICD-10-CM

## 2021-06-17 DIAGNOSIS — O24410 Gestational diabetes mellitus in pregnancy, diet controlled: Secondary | ICD-10-CM

## 2021-06-17 DIAGNOSIS — O26893 Other specified pregnancy related conditions, third trimester: Secondary | ICD-10-CM | POA: Diagnosis present

## 2021-06-17 DIAGNOSIS — O9081 Anemia of the puerperium: Secondary | ICD-10-CM | POA: Diagnosis not present

## 2021-06-17 DIAGNOSIS — Z6791 Unspecified blood type, Rh negative: Secondary | ICD-10-CM | POA: Diagnosis not present

## 2021-06-17 DIAGNOSIS — O24420 Gestational diabetes mellitus in childbirth, diet controlled: Secondary | ICD-10-CM | POA: Diagnosis present

## 2021-06-17 DIAGNOSIS — O24419 Gestational diabetes mellitus in pregnancy, unspecified control: Secondary | ICD-10-CM | POA: Diagnosis present

## 2021-06-17 LAB — GLUCOSE, CAPILLARY
Glucose-Capillary: 90 mg/dL (ref 70–99)
Glucose-Capillary: 80 mg/dL (ref 70–99)
Glucose-Capillary: 81 mg/dL (ref 70–99)

## 2021-06-17 LAB — CBC
HCT: 33.7 % — ABNORMAL LOW (ref 36.0–46.0)
Hemoglobin: 10.6 g/dL — ABNORMAL LOW (ref 12.0–15.0)
MCH: 24.7 pg — ABNORMAL LOW (ref 26.0–34.0)
MCHC: 31.5 g/dL (ref 30.0–36.0)
MCV: 78.6 fL — ABNORMAL LOW (ref 80.0–100.0)
Platelets: 253 10*3/uL (ref 150–400)
RBC: 4.29 MIL/uL (ref 3.87–5.11)
RDW: 16.1 % — ABNORMAL HIGH (ref 11.5–15.5)
WBC: 7.3 10*3/uL (ref 4.0–10.5)
nRBC: 0 % (ref 0.0–0.2)

## 2021-06-17 LAB — RPR: RPR Ser Ql: NONREACTIVE

## 2021-06-17 MED ORDER — LIDOCAINE HCL (PF) 1 % IJ SOLN
INTRAMUSCULAR | Status: DC | PRN
  Administered 2021-06-17 (×2): 4 mL via EPIDURAL

## 2021-06-17 MED ORDER — COCONUT OIL OIL: 1.0000 "application " | TOPICAL_OIL | Status: DC | PRN

## 2021-06-17 MED ORDER — OXYCODONE-ACETAMINOPHEN 5-325 MG PO TABS: 1.0000 | ORAL_TABLET | ORAL | Status: DC | PRN

## 2021-06-17 MED ORDER — LACTATED RINGERS IV SOLN
500.0000 mL | Freq: Once | INTRAVENOUS | Status: AC
  Administered 2021-06-17: 500 mL via INTRAVENOUS

## 2021-06-17 MED ORDER — OXYTOCIN BOLUS FROM INFUSION
333.0000 mL | Freq: Once | INTRAVENOUS | Status: AC
  Administered 2021-06-17: 333 mL via INTRAVENOUS

## 2021-06-17 MED ORDER — SOD CITRATE-CITRIC ACID 500-334 MG/5ML PO SOLN: 30.0000 mL | ORAL | Status: DC | PRN

## 2021-06-17 MED ORDER — LACTATED RINGERS IV SOLN: 500.0000 mL | INTRAVENOUS | Status: DC | PRN

## 2021-06-17 MED ORDER — SENNOSIDES-DOCUSATE SODIUM 8.6-50 MG PO TABS
2.0000 | ORAL_TABLET | Freq: Every day | ORAL | Status: DC
  Administered 2021-06-18 – 2021-06-19 (×2): 2 via ORAL
  Filled 2021-06-17 (×2): qty 2

## 2021-06-17 MED ORDER — DIBUCAINE (PERIANAL) 1 % EX OINT: 1.0000 "application " | TOPICAL_OINTMENT | CUTANEOUS | Status: DC | PRN

## 2021-06-17 MED ORDER — ACETAMINOPHEN 325 MG PO TABS: 650.0000 mg | ORAL_TABLET | ORAL | Status: DC | PRN

## 2021-06-17 MED ORDER — PHENYLEPHRINE 40 MCG/ML (10ML) SYRINGE FOR IV PUSH (FOR BLOOD PRESSURE SUPPORT): 80.0000 ug | PREFILLED_SYRINGE | INTRAVENOUS | Status: DC | PRN

## 2021-06-17 MED ORDER — OXYTOCIN-SODIUM CHLORIDE 30-0.9 UT/500ML-% IV SOLN
2.5000 [IU]/h | INTRAVENOUS | Status: DC
  Filled 2021-06-17: qty 500

## 2021-06-17 MED ORDER — ONDANSETRON HCL 4 MG PO TABS: 4.0000 mg | ORAL_TABLET | ORAL | Status: DC | PRN

## 2021-06-17 MED ORDER — LACTATED RINGERS IV SOLN: INTRAVENOUS | Status: DC

## 2021-06-17 MED ORDER — PRENATAL MULTIVITAMIN CH
1.0000 | ORAL_TABLET | Freq: Every day | ORAL | Status: DC
  Administered 2021-06-18 – 2021-06-19 (×2): 1 via ORAL
  Filled 2021-06-17 (×2): qty 1

## 2021-06-17 MED ORDER — OXYCODONE HCL 5 MG PO TABS: 10.0000 mg | ORAL_TABLET | ORAL | Status: DC | PRN

## 2021-06-17 MED ORDER — ZOLPIDEM TARTRATE 5 MG PO TABS: 5.0000 mg | ORAL_TABLET | Freq: Every evening | ORAL | Status: DC | PRN

## 2021-06-17 MED ORDER — FENTANYL-BUPIVACAINE-NACL 0.5-0.125-0.9 MG/250ML-% EP SOLN
12.0000 mL/h | EPIDURAL | Status: DC | PRN
  Administered 2021-06-17: 12 mL/h via EPIDURAL
  Filled 2021-06-17: qty 250

## 2021-06-17 MED ORDER — OXYTOCIN-SODIUM CHLORIDE 30-0.9 UT/500ML-% IV SOLN
1.0000 m[IU]/min | INTRAVENOUS | Status: DC
  Administered 2021-06-17: 2 m[IU]/min via INTRAVENOUS
  Filled 2021-06-17: qty 500

## 2021-06-17 MED ORDER — TERBUTALINE SULFATE 1 MG/ML IJ SOLN: 0.2500 mg | Freq: Once | INTRAMUSCULAR | Status: DC | PRN

## 2021-06-17 MED ORDER — TETANUS-DIPHTH-ACELL PERTUSSIS 5-2.5-18.5 LF-MCG/0.5 IM SUSY: 0.5000 mL | PREFILLED_SYRINGE | Freq: Once | INTRAMUSCULAR | Status: DC

## 2021-06-17 MED ORDER — SIMETHICONE 80 MG PO CHEW: 80.0000 mg | CHEWABLE_TABLET | ORAL | Status: DC | PRN

## 2021-06-17 MED ORDER — ONDANSETRON HCL 4 MG/2ML IJ SOLN: 4.0000 mg | Freq: Four times a day (QID) | INTRAMUSCULAR | Status: DC | PRN

## 2021-06-17 MED ORDER — DIPHENHYDRAMINE HCL 25 MG PO CAPS: 25.0000 mg | ORAL_CAPSULE | Freq: Four times a day (QID) | ORAL | Status: DC | PRN

## 2021-06-17 MED ORDER — EPHEDRINE 5 MG/ML INJ: 10.0000 mg | INTRAVENOUS | Status: DC | PRN

## 2021-06-17 MED ORDER — LIDOCAINE HCL (PF) 1 % IJ SOLN: 30.0000 mL | INTRAMUSCULAR | Status: DC | PRN

## 2021-06-17 MED ORDER — OXYCODONE HCL 5 MG PO TABS: 5.0000 mg | ORAL_TABLET | ORAL | Status: DC | PRN

## 2021-06-17 MED ORDER — ONDANSETRON HCL 4 MG/2ML IJ SOLN: 4.0000 mg | INTRAMUSCULAR | Status: DC | PRN

## 2021-06-17 MED ORDER — OXYTOCIN-SODIUM CHLORIDE 30-0.9 UT/500ML-% IV SOLN: 1.0000 m[IU]/min | INTRAVENOUS | Status: DC

## 2021-06-17 MED ORDER — IBUPROFEN 600 MG PO TABS
600.0000 mg | ORAL_TABLET | Freq: Four times a day (QID) | ORAL | Status: DC
  Administered 2021-06-17 – 2021-06-19 (×7): 600 mg via ORAL
  Filled 2021-06-17 (×7): qty 1

## 2021-06-17 MED ORDER — WITCH HAZEL-GLYCERIN EX PADS
1.0000 "application " | MEDICATED_PAD | CUTANEOUS | Status: DC | PRN
  Administered 2021-06-18: 1 via TOPICAL

## 2021-06-17 MED ORDER — BENZOCAINE-MENTHOL 20-0.5 % EX AERO
1.0000 "application " | INHALATION_SPRAY | CUTANEOUS | Status: DC | PRN
  Administered 2021-06-18: 1 via TOPICAL
  Filled 2021-06-17: qty 56

## 2021-06-17 MED ORDER — OXYCODONE-ACETAMINOPHEN 5-325 MG PO TABS: 2.0000 | ORAL_TABLET | ORAL | Status: DC | PRN

## 2021-06-17 MED ORDER — DIPHENHYDRAMINE HCL 50 MG/ML IJ SOLN: 12.5000 mg | INTRAMUSCULAR | Status: DC | PRN

## 2021-06-17 NOTE — Anesthesia Procedure Notes (Signed)
Epidural Patient location during procedure: OB Start time: 06/17/2021 10:48 AM End time: 06/17/2021 10:57 AM  Preanesthetic Checklist Completed: patient identified, IV checked, site marked, risks and benefits discussed, surgical consent, monitors and equipment checked, pre-op evaluation and timeout performed  Epidural Patient position: sitting Prep: DuraPrep and site prepped and draped Patient monitoring: continuous pulse ox and blood pressure Approach: midline Location: L3-L4 Injection technique: LOR air  Needle:  Needle type: Tuohy  Needle gauge: 17 G Needle length: 9 cm and 9 Needle insertion depth: 5 cm Catheter type: closed end flexible Catheter size: 19 Gauge Catheter at skin depth: 10 cm Test dose: negative and Other  Assessment Events: blood not aspirated, injection not painful, no injection resistance, no paresthesia and negative IV test  Additional Notes Patient identified. Risks and benefits discussed including failed block, incomplete  Pain control, post dural puncture headache, nerve damage, paralysis, blood pressure Changes, nausea, vomiting, reactions to medications-both toxic and allergic and post Partum back pain. All questions were answered. Patient expressed understanding and wished to proceed. Sterile technique was used throughout procedure. Epidural site was Dressed with sterile barrier dressing. No paresthesias, signs of intravascular injection Or signs of intrathecal spread were encountered.  Patient was more comfortable after the epidural was dosed. Please see RN's note for documentation of vital signs and FHR which are stable. Reason for block:procedure for pain

## 2021-06-17 NOTE — Anesthesia Preprocedure Evaluation (Signed)
Anesthesia Evaluation  Patient identified by MRN, date of birth, ID band Patient awake    Reviewed: Allergy & Precautions, Patient's Chart, lab work & pertinent test results  Airway Mallampati: II  TM Distance: >3 FB Neck ROM: Full    Dental no notable dental hx. (+) Teeth Intact   Pulmonary neg pulmonary ROS,    Pulmonary exam normal breath sounds clear to auscultation       Cardiovascular negative cardio ROS Normal cardiovascular exam Rhythm:Regular Rate:Normal     Neuro/Psych negative neurological ROS  negative psych ROS   GI/Hepatic Neg liver ROS, GERD  ,  Endo/Other  diabetes, Well Controlled, GestationalObesity  Renal/GU negative Renal ROS  negative genitourinary   Musculoskeletal negative musculoskeletal ROS (+)   Abdominal (+) + obese,   Peds  Hematology  (+) anemia ,   Anesthesia Other Findings   Reproductive/Obstetrics (+) Pregnancy                             Anesthesia Physical Anesthesia Plan  ASA: 2  Anesthesia Plan: Epidural   Post-op Pain Management:    Induction:   PONV Risk Score and Plan:   Airway Management Planned: Natural Airway  Additional Equipment:   Intra-op Plan:   Post-operative Plan:   Informed Consent: I have reviewed the patients History and Physical, chart, labs and discussed the procedure including the risks, benefits and alternatives for the proposed anesthesia with the patient or authorized representative who has indicated his/her understanding and acceptance.       Plan Discussed with: Anesthesiologist  Anesthesia Plan Comments:         Anesthesia Quick Evaluation

## 2021-06-17 NOTE — Progress Notes (Signed)
LATRICE STORLIE is a 30 y.o. G3P0010 at [redacted]w[redacted]d admitted for induction of labor due to Gestational diabetes.  Subjective:  [place] is feeling ctxs. Requesting epidural. POC discussed. Will get epidural and AROM when comfortable.  All questions answered.  Objective: BP 120/77   Pulse 87   Temp 98.8 F (37.1 C) (Oral)   Resp 17   Ht 5\' 5"  (1.651 m)   Wt 91.3 kg   SpO2 99%   BMI 33.48 kg/m  No intake/output data recorded. No intake/output data recorded.  FHT:  FHR: 135 bpm, variability: moderate,  accelerations:  Present,  decelerations:  Absent UC:   regular, every 3-4 minutes SVE:   Dilation: 5 Effacement (%): 90 Station: 0 Exam by:: [email or web address] RN  Labs: Lab Results  Component Value Date   WBC 7.3 06/17/2021   HGB 10.6 (L) 06/17/2021   HCT 33.7 (L) 06/17/2021   MCV 78.6 (L) 06/17/2021   PLT 253 06/17/2021    Assessment / Plan: Induction of labor due to gestational diabetes,  progressing well on pitocin  Labor: Progressing on Pitocin, will continue to increase then AROM Fetal Wellbeing:  Category I Pain Control:  Epidural prn I/D:   GBS negative Anticipated MOD:  NSVD Dr 06/19/2021 notified of pt status and POC  Normand Sloop Rian Busche MSN, CNM 06/17/2021, 10:19 AM

## 2021-06-17 NOTE — Progress Notes (Signed)
Subjective:    Pt comfortable with epidural in place. AROM R/B/A discussed. Pt consent to AROM. AROM without difficulty. Clear fluid.   Objective:    VS: BP 112/69   Pulse 84   Temp 98.8 F (37.1 C) (Oral)   Resp 18   Ht 5\' 5"  (1.651 m)   Wt 91.3 kg   SpO2 100%   BMI 33.48 kg/m  FHR : baseline 135 / variability moderate / accelerations present / absent decelerations Toco: contractions every 2-4 minutes / Membranes: AROM clear  SVE: 7/90/-1 Pitocin 12 mU/min  Assessment/Plan:   30 y.o. G3P0010 [redacted]w[redacted]d IOL for GDMA1  Labor: Progressing normally on pitocin. AROM complete. Preeclampsia:  no signs or symptoms of toxicity Fetal Wellbeing:  Category I Pain Control:  Epidural I/D:   GBS negative Anticipated MOD:  NSVD Dr [redacted]w[redacted]d aware of pt status and POC  Normand Sloop MSN, CNM 06/17/2021 1:42 PM

## 2021-06-18 DIAGNOSIS — D62 Acute posthemorrhagic anemia: Secondary | ICD-10-CM | POA: Diagnosis not present

## 2021-06-18 LAB — TYPE AND SCREEN
ABO/RH(D): A NEG
Antibody Screen: NEGATIVE

## 2021-06-18 LAB — CBC
HCT: 26 % — ABNORMAL LOW (ref 36.0–46.0)
Hemoglobin: 8.2 g/dL — ABNORMAL LOW (ref 12.0–15.0)
MCH: 24.8 pg — ABNORMAL LOW (ref 26.0–34.0)
MCHC: 31.5 g/dL (ref 30.0–36.0)
MCV: 78.5 fL — ABNORMAL LOW (ref 80.0–100.0)
Platelets: 222 10*3/uL (ref 150–400)
RBC: 3.31 MIL/uL — ABNORMAL LOW (ref 3.87–5.11)
RDW: 16.2 % — ABNORMAL HIGH (ref 11.5–15.5)
WBC: 11 10*3/uL — ABNORMAL HIGH (ref 4.0–10.5)
nRBC: 0 % (ref 0.0–0.2)

## 2021-06-18 LAB — GLUCOSE, CAPILLARY
Glucose-Capillary: 37 mg/dL — CL (ref 70–99)
Glucose-Capillary: 114 mg/dL — ABNORMAL HIGH (ref 70–99)

## 2021-06-18 MED ORDER — POLYSACCHARIDE IRON COMPLEX 150 MG PO CAPS
150.0000 mg | ORAL_CAPSULE | Freq: Every day | ORAL | Status: DC
  Administered 2021-06-18 – 2021-06-19 (×2): 150 mg via ORAL
  Filled 2021-06-18 (×2): qty 1

## 2021-06-18 MED ORDER — RHO D IMMUNE GLOBULIN 1500 UNIT/2ML IJ SOSY
300.0000 ug | PREFILLED_SYRINGE | Freq: Once | INTRAMUSCULAR | Status: AC
  Administered 2021-06-18: 300 ug via INTRAVENOUS
  Filled 2021-06-18: qty 2

## 2021-06-18 NOTE — Anesthesia Postprocedure Evaluation (Signed)
Anesthesia Post Note  Patient: Grace [name]  Procedure(s) Performed: AN AD HOC LABOR EPIDURAL     Patient location during evaluation: Mother Baby Anesthesia Type: Epidural Level of consciousness: awake Pain management: satisfactory to patient Vital Signs Assessment: post-procedure vital signs reviewed and stable Respiratory status: spontaneous breathing Cardiovascular status: stable Anesthetic complications: no   No notable events documented.  Last Vitals:  Vitals:   06/18/21 1030 06/18/21 1352  BP: 114/69 120/68  Pulse: 86 90  Resp: 18 20  Temp: 36.4 C 36.7 C  SpO2: 100% 98%    Last Pain:  Vitals:   06/18/21 1352  TempSrc: Oral  PainSc:    Pain Goal:                   [employer]

## 2021-06-18 NOTE — Progress Notes (Signed)
Subjective: Postpartum Day # 1 : S/P NSVD due to pt admitted on 7/12 for IOL at 40 weeks for GDMA1, FBS was 114, no meds, progressed with AROM and pitocin to SVD on 7/13 @ 1943 over bilaterally periurethral repairs, ebl was with hgb drop of 10.6-8.2, asymptomatic, PO Iron taken. Patient up ad lib, denies syncope or dizziness. Reports consuming regular diet without issues and denies N/V. Patient reports 0 bowel movement + passing flatus.  Denies issues with urination and reports bleeding is "lighter."  Patient is bottle feeding and reports going well.  Desires po pills for postpartum contraception.  Pain is being appropriately managed with use of po meds.   Bilateral periurethral laceration Feeding:  bottle Contraceptive plan:  pills BB: Circ completed today.   Objective: Vital signs in last 24 hours: Patient Vitals for the past 24 hrs:  BP Temp Temp src Pulse Resp SpO2  06/18/21 1352 120/68 98.1 F (36.7 C) Oral 90 20 98 %  06/18/21 1030 114/69 97.6 F (36.4 C) Oral 86 18 100 %  06/18/21 0638 (!) 124/59 98.3 F (36.8 C) Oral 93 18 99 %  06/18/21 0259 122/72 98.5 F (36.9 C) Oral (!) 105 17 100 %  06/17/21 2306 139/82 98.4 F (36.9 C) Oral (!) 102 17 100 %  06/17/21 2153 121/70 99.5 F (37.5 C) Oral -- 18 100 %  06/17/21 2115 121/66 -- -- (!) 103 -- --  06/17/21 2015 131/79 -- -- (!) 116 -- --  06/17/21 2000 128/73 -- -- (!) 104 -- --  06/17/21 1901 130/83 -- -- 91 16 --  06/17/21 1831 129/71 98.1 F (36.7 C) Oral 87 16 --  06/17/21 1801 140/88 -- -- 100 16 --  06/17/21 1738 (!) 138/91 -- -- (!) 104 16 --  06/17/21 1701 126/81 -- -- 88 16 --  06/17/21 1631 138/84 -- -- 94 17 --  06/17/21 1601 (!) 138/93 98.5 F (36.9 C) Oral 100 16 --  06/17/21 1531 128/79 -- -- (!) 102 16 --  06/17/21 1501 (!) 114/99 -- -- -- 17 --  06/17/21 1431 116/71 -- -- 81 17 --     Physical Exam:  General: alert, cooperative, and appears stated age Mood/Affect: happy Lungs: clear to  auscultation, no wheezes, rales or rhonchi, symmetric air entry.  Heart: normal rate, regular rhythm, normal S1, S2, no murmurs, rubs, clicks or gallops. Breast: breasts appear normal, no suspicious masses, no skin or nipple changes or axillary nodes. Abdomen:  + bowel sounds, soft, non-tender GU: perineum approximate, healing well. No signs of external hematomas.  Uterine Fundus: firm Lochia: appropriate Skin: Warm, Dry. DVT Evaluation: No evidence of DVT seen on physical exam. Negative Homan's sign. No cords or calf tenderness. No significant calf/ankle edema.  CBC Latest Ref Rng & Units 06/18/2021 06/17/2021 11/30/2019  WBC 4.0 - 10.5 K/uL 11.0(H) 7.3 7.3  Hemoglobin 12.0 - 15.0 g/dL 8.2(L) 10.6(L) 12.3  Hematocrit 36.0 - 46.0 % 26.0(L) 33.7(L) 35.1(L)  Platelets 150 - 400 K/uL 222 253 289    Results for orders placed or performed during the hospital encounter of 06/17/21 (from the past 24 hour(s))  Glucose, capillary     Status: None   Collection Time: 06/17/21  4:07 PM  Result Value Ref Range   Glucose-Capillary 81 70 - 99 mg/dL  Glucose, capillary     Status: Abnormal   Collection Time: 06/18/21  5:20 AM  Result Value Ref Range   Glucose-Capillary 114 (H) 70 - 99  mg/dL  CBC     Status: Abnormal   Collection Time: 06/18/21  7:45 AM  Result Value Ref Range   WBC 11.0 (H) 4.0 - 10.5 K/uL   RBC 3.31 (L) 3.87 - 5.11 MIL/uL   Hemoglobin 8.2 (L) 12.0 - 15.0 g/dL   HCT 75.1 (L) 02.5 - 85.2 %   MCV 78.5 (L) 80.0 - 100.0 fL   MCH 24.8 (L) 26.0 - 34.0 pg   MCHC 31.5 30.0 - 36.0 g/dL   RDW 77.8 (H) 24.2 - 35.3 %   Platelets 222 150 - 400 K/uL   nRBC 0.0 0.0 - 0.2 %  Rh IG workup (includes ABO/Rh)     Status: None (Preliminary result)   Collection Time: 06/18/21  7:45 AM  Result Value Ref Range   Gestational Age(Wks) 40    Fetal Screen NEG    Unit Number [id number]    Blood Component Type RHIG    Unit division 00    Status of Unit ISSUED    Transfusion Status      OK TO  TRANSFUSE Performed at [facility], [address], [place], [place] [address]      CBG (last 3)  Recent Labs    06/17/21 1145 06/17/21 1607 06/18/21 0520  GLUCAP 80 81 114*     I/O last 3 completed shifts: In: -  Out: 2250 [Urine:2050; Blood:200]   Assessment Postpartum Day # 1 : S/P NSVD due to pt admitted on 7/12 for IOL at 40 weeks for GDMA1, FBS was 114, no meds, progressed with AROM and pitocin to SVD on 7/13 @ 1943 over bilaterally periurethral repairs, ebl was with hgb drop of 10.6-8.2, asymptomatic, PO Iron taken. Pt stable. -1 involution. Bottle feeding. Hemodynamically stable.   Plan: Continue other mgmt as ordered Anemia: PO Iron GDMA1: F/U 6 weeks for 2HGTT. VTE prophylactics: Early ambulated as tolerates.  Pain control: Motrin/Tylenol PRN Education given regarding options for contraception, including barrier methods, injectable contraception, IUD placement, oral contraceptives.  Plan for discharge tomorrow, Breastfeeding, and Contraception PO pills  Dr. Su Hilt to be updated on patient status  [facility] NP-C, CNM 06/18/2021, 2:16 PM

## 2021-06-19 LAB — RH IG WORKUP (INCLUDES ABO/RH)
Fetal Screen: NEGATIVE
Gestational Age(Wks): 40
Unit division: 0

## 2021-06-19 MED ORDER — IBUPROFEN 600 MG PO TABS: 600.0000 mg | ORAL_TABLET | Freq: Four times a day (QID) | ORAL | 0 refills | Status: AC

## 2021-06-19 MED ORDER — ACETAMINOPHEN 325 MG PO TABS: 650.0000 mg | ORAL_TABLET | ORAL | Status: AC | PRN

## 2021-06-19 NOTE — Discharge Summary (Signed)
Postpartum Discharge Summary  Date of Service updated 06/19/21    Patient Name: Grace [name] DOB: Feb 10, 1991 MRN: [id number]  Date of admission: 06/17/2021 Delivery date:06/17/2021  Delivering provider: Holli Humbles B  Date of discharge: 06/19/2021  Admitting diagnosis: Pregnant [Z34.90] Intrauterine pregnancy: [redacted]w[redacted]d    Secondary diagnosis:  Active Problems:   Normal labor   Gestational diabetes mellitus (GDM), antepartum   Rh negative, maternal   Normal postpartum course   Acute blood loss anemia   SVD (spontaneous vaginal delivery)  Additional problems: none    Discharge diagnosis: Term Pregnancy Delivered and GDM A1                                              Post partum procedures:rhogam and IV iron infusion Augmentation: AROM and Pitocin Complications: None  Hospital course: Induction of Labor With Vaginal Delivery   30y.o. yo G3P1011 at 417w0das admitted to the hospital 06/17/2021 for induction of labor.  Indication for induction: A1 DM.  Patient had an uncomplicated labor course as follows: Membrane Rupture Time/Date: 1:30 PM ,06/17/2021   Delivery Method:Vaginal, Spontaneous  Episiotomy: None  Lacerations:  Periurethral  Details of delivery can be found in separate delivery note.  Patient had a routine postpartum course. Patient is discharged home 06/19/21.  Newborn Data: Birth date:06/17/2021  Birth time:7:43 PM  Gender:Female  Living status:Living  Apgars:9 ,9  Weight:3771 g   Magnesium Sulfate received: No BMZ received: No Rhophylac:Yes MMR:N/A T-DaP: declined Flu: No Transfusion:No  Physical exam  Vitals:   06/18/21 1030 06/18/21 1352 06/18/21 2005 06/19/21 0517  BP: 114/69 120/68 (!) 100/58 114/87  Pulse: 86 90 96 98  Resp: '18 20 18 17  ' Temp: 97.6 F (36.4 C) 98.1 F (36.7 C) 98.4 F (36.9 C) 98.1 F (36.7 C)  TempSrc: Oral Oral Oral Oral  SpO2: 100% 98% 100% 100%  Weight:      Height:       General: alert, cooperative, and  no distress Lochia: appropriate Uterine Fundus: firm Perineum: well-approximated and healing DVT Evaluation: No evidence of DVT seen on physical exam. No cords or calf tenderness. No significant calf/ankle edema. Labs: Lab Results  Component Value Date   WBC 11.0 (H) 06/18/2021   HGB 8.2 (L) 06/18/2021   HCT 26.0 (L) 06/18/2021   MCV 78.5 (L) 06/18/2021   PLT 222 06/18/2021   CMP Latest Ref Rng & Units 01/04/2018  Glucose 65 - 99 mg/dL -  BUN 7 - 25 mg/dL -  Creatinine 0.50 - 1.10 mg/dL -  Sodium 135 - 146 mmol/L -  Potassium 3.5 - 5.3 mmol/L -  Chloride 98 - 110 mmol/L -  CO2 20 - 32 mmol/L -  Calcium 8.6 - 10.2 mg/dL -  Total Protein 6.0 - 8.5 g/dL 7.1  Total Bilirubin 0.0 - 1.2 mg/dL 0.3  Alkaline Phos 39 - 117 IU/L 54  AST 0 - 40 IU/L 27  ALT 0 - 32 IU/L 27   Edinburgh Score: Edinburgh Postnatal Depression Scale Screening Tool 06/18/2021  I have been able to laugh and see the funny side of things. 0  I have looked forward with enjoyment to things. 0  I have blamed myself unnecessarily when things went wrong. 0  I have been anxious or worried for no good reason. 0  I have felt scared  or panicky for no good reason. 0  Things have been getting on top of me. 0  I have been so unhappy that I have had difficulty sleeping. 2  I have felt sad or miserable. 1  I have been so unhappy that I have been crying. 0  The thought of harming myself has occurred to me. 0  Edinburgh Postnatal Depression Scale Total 3      After visit meds:  Allergies as of 06/19/2021   No Known Allergies      Medication List     TAKE these medications    acetaminophen 325 MG tablet Commonly known as: Tylenol Take 2 tablets (650 mg total) by mouth every 4 (four) hours as needed (for pain scale < 4).   ibuprofen 600 MG tablet Commonly known as: ADVIL Take 1 tablet (600 mg total) by mouth every 6 (six) hours.   prenatal multivitamin Tabs tablet Take 1 tablet by mouth daily at 12 noon.          Discharge home in stable condition Infant Feeding: Bottle and formula Infant Disposition:home with mother Discharge instruction: per After Visit Summary and Postpartum booklet. Activity: Advance as tolerated. Pelvic rest for 6 weeks.  Diet: carb modified diet Anticipated Birth Control: OCPs Postpartum Appointment:6 weeks Additional Postpartum F/U: 2 hour GTT Future Appointments:No future appointments. Follow up Visit:  [facility]. Go in 6 week(s).   Specialty: Obstetrics and Gynecology Contact information: [address] [place] [address] [phone]                    06/19/2021 Grace [name], CNM

## 2021-06-29 ENCOUNTER — Telehealth (HOSPITAL_COMMUNITY): Payer: Self-pay | Admitting: *Deleted

## 2021-06-29 NOTE — Telephone Encounter (Signed)
Attempted Hospital Discharge Follow-Up Call.  Left voice mail requesting that patient return RN's phone call.  

## 2024-01-07 ENCOUNTER — Ambulatory Visit
Admission: EM | Admit: 2024-01-07 | Discharge: 2024-01-07 | Disposition: A | Discharge status: Home / Self Care | Payer: Self-pay | Attending: Family Medicine | Admitting: Family Medicine

## 2024-01-07 DIAGNOSIS — S39012A Strain of muscle, fascia and tendon of lower back, initial encounter: Secondary | ICD-10-CM

## 2024-01-07 MED ORDER — CYCLOBENZAPRINE HCL 5 MG PO TABS: 5.0000 mg | ORAL_TABLET | Freq: Every evening | ORAL | 0 refills | Status: DC | PRN

## 2024-01-07 MED ORDER — NAPROXEN 500 MG PO TABS: 500.0000 mg | ORAL_TABLET | Freq: Two times a day (BID) | ORAL | 0 refills | Status: DC
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
## 2024-01-29 ENCOUNTER — Ambulatory Visit
Admission: RE | Admit: 2024-01-29 | Discharge: 2024-01-29 | Disposition: A | Discharge status: Home / Self Care | Payer: Self-pay | Source: Ambulatory Visit | Attending: Family Medicine | Admitting: Family Medicine

## 2024-01-29 VITALS — BP 125/79 | HR 103 | Temp 98.1°F | Resp 16 | Wt 196.0 lb

## 2024-01-29 DIAGNOSIS — S39012D Strain of muscle, fascia and tendon of lower back, subsequent encounter: Secondary | ICD-10-CM | POA: Diagnosis not present

## 2024-01-29 MED ORDER — NAPROXEN 500 MG PO TABS: 500.0000 mg | ORAL_TABLET | Freq: Two times a day (BID) | ORAL | 0 refills | Status: AC | PRN

## 2024-01-29 MED ORDER — CYCLOBENZAPRINE HCL 10 MG PO TABS: 10.0000 mg | ORAL_TABLET | Freq: Every day | ORAL | 0 refills | Status: AC

## 2024-01-29 NOTE — Discharge Instructions (Addendum)
 You may take Flexeril at bedtime as needed for your back pain.  Please note this medication will make you drowsy.  Do not drink alcohol or drive on this medication.  You may take naproxen twice daily as needed for your back pain.  Take this with food.  Heat to the low back and rest.  Please follow-up with your PCP if your symptoms do not improve.  Please go to the ER for any worsening symptoms.  Hope you feel better soon!
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
## 2024-07-02 ENCOUNTER — Ambulatory Visit (INDEPENDENT_AMBULATORY_CARE_PROVIDER_SITE_OTHER): Admitting: Primary Care

## 2024-07-02 VITALS — BP 125/84 | HR 81 | Resp 16 | Ht 64.5 in | Wt 192.6 lb

## 2024-07-02 DIAGNOSIS — Z6832 Body mass index (BMI) 32.0-32.9, adult: Secondary | ICD-10-CM | POA: Diagnosis not present

## 2024-07-02 DIAGNOSIS — E669 Obesity, unspecified: Secondary | ICD-10-CM | POA: Diagnosis not present

## 2024-07-02 DIAGNOSIS — O24419 Gestational diabetes mellitus in pregnancy, unspecified control: Secondary | ICD-10-CM

## 2024-07-02 DIAGNOSIS — Z13220 Encounter for screening for lipoid disorders: Secondary | ICD-10-CM

## 2024-07-02 DIAGNOSIS — E66811 Obesity, class 1: Secondary | ICD-10-CM

## 2024-07-02 DIAGNOSIS — Z7689 Persons encountering health services in other specified circumstances: Secondary | ICD-10-CM

## 2024-07-02 NOTE — Progress Notes (Signed)
 New Patient Office Visit  Subjective    Patient ID: Grace [name] female  DOB: 1991/05/31  Age: 33 y.o. MRN: [id number]   CC:  Fatigue, concerned if diabetic- she had gestational diabetes   [name] is a 33 year old obese female in today to establish care. Concerns were voiced.chief complaint. Patient has No headache, No chest pain, No abdominal pain - No Nausea, No new weakness tingling or numbness, No Cough - shortness of breath/s Current Outpatient Medications on File Prior to Visit  Medication Sig Dispense Refill  . Norethindrone Acetate-Ethinyl Estrad-FE (HAILEY 24 FE) 1-20 MG-MCG(24) tablet Take 1 tablet by mouth daily.    . acetaminophen  (TYLENOL ) 325 MG tablet Take 2 tablets (650 mg total) by mouth every 4 (four) hours as needed (for pain scale < 4). (Patient not taking: Reported on 07/02/2024)    . cyclobenzaprine  (FLEXERIL ) 10 MG tablet Take 1 tablet (10 mg total) by mouth at bedtime. (Patient not taking: Reported on 07/02/2024) 5 tablet 0  . ibuprofen  (ADVIL ) 600 MG tablet Take 1 tablet (600 mg total) by mouth every 6 (six) hours. (Patient not taking: Reported on 07/02/2024) 30 tablet 0  . naproxen  (NAPROSYN ) 500 MG tablet Take 1 tablet (500 mg total) by mouth 2 (two) times daily as needed. (Patient not taking: Reported on 07/02/2024) 14 tablet 0  . Prenatal Vit-Fe Fumarate-FA (PRENATAL MULTIVITAMIN) TABS tablet Take 1 tablet by mouth daily at 12 noon. (Patient not taking: Reported on 07/02/2024)    . [DISCONTINUED] medroxyPROGESTERone  (DEPO-PROVERA ) 150 MG/ML injection Inject 150 mg into the muscle every 3 (three) months.      No current facility-administered medications on file prior to visit.     Allergies  Allergen Reactions  . Ceftriaxone  Itching and Swelling    Past Medical History:  Diagnosis Date  . Bacterial vaginosis   . Chlamydia   . Gestational diabetes   . Medical history non-contributory   . Tooth ache      Past Surgical History:  Procedure  Laterality Date  . NO PAST SURGERIES       Family History  Problem Relation Age of Onset  . Arthritis Maternal Grandmother   . Heart disease Maternal Grandmother   . Stroke Maternal Grandmother   . Hypertension Maternal Grandmother   . Allergies Mother   . Allergies Brother     Social History   Socioeconomic History  . Marital status: Single    Spouse name: Not on file  . Number of children: Not on file  . Years of education: Not on file  . Highest education level: Some college, no degree  Occupational History  . Not on file  Tobacco Use  . Smoking status: Never  . Smokeless tobacco: Never  Vaping Use  . Vaping status: Never Used  Substance and Sexual Activity  . Alcohol use: Not Currently    Comment: twice a month  . Drug use: No  . Sexual activity: Yes    Birth control/protection: Pill  Other Topics Concern  . Not on file  Social History Narrative  . Not on file   Social Drivers of Health   Financial Resource Strain: Low Risk  (07/02/2024)   Overall Financial Resource Strain (CARDIA)   . Difficulty of Paying Living Expenses: Not hard at all  Food Insecurity: No Food Insecurity (07/02/2024)   Hunger Vital Sign   . Worried About Programme researcher, broadcasting/film/video in the Last Year: Never true   . Ran Out of  Food in the Last Year: Never true  Transportation Needs: No Transportation Needs (07/02/2024)   PRAPARE - Transportation   . Lack of Transportation (Medical): No   . Lack of Transportation (Non-Medical): No  Physical Activity: Insufficiently Active (07/02/2024)   Exercise Vital Sign   . Days of Exercise per Week: 2 days   . Minutes of Exercise per Session: 60 min  Stress: No Stress Concern Present (07/02/2024)   Grace [name] [employer] Occupational [employer] - Occupational Stress Questionnaire   . Feeling of Stress: Not at all  Social Connections: Moderately Integrated (07/02/2024)   Social Connection and Isolation Panel   . Frequency of Communication with Friends and Family:  Twice a week   . Frequency of Social Gatherings with Friends and Family: Once a week   . Attends Religious Services: 1 to 4 times per year   . Active Member of Clubs or Organizations: No   . Attends Banker Meetings: Not on file   . Marital Status: Living with partner  Intimate Partner Violence: Not on file   Health Maintenance  Topic Date Due  . DTaP/Tdap/Td vaccine (1 - Tdap) Never done  . Hepatitis B Vaccine (1 of 3 - 19+ 3-dose series) Never done  . HPV Vaccine (2 - 3-dose series) 12/15/2017  . Pap with HPV screening  07/16/2022  . COVID-19 Vaccine (1 - 2024-25 season) Never done  . Flu Shot  07/06/2024  . Hepatitis C Screening  Completed  . HIV Screening  Completed  . Meningitis B Vaccine  Aged Out   Objective    BP 125/84   Pulse 81   Resp 16   Ht 5' 4.5 (1.638 m)   Wt 192 lb 9.6 oz (87.4 kg)   SpO2 98%   BMI 32.55 kg/m   Physical Exam Vitals reviewed.  Constitutional:      Appearance: Normal appearance. She is obese.  HENT:     Head: Normocephalic.     Right Ear: Tympanic membrane, ear canal and external ear normal.     Left Ear: Tympanic membrane, ear canal and external ear normal.     Nose: Nose normal.     Mouth/Throat:     Mouth: Mucous membranes are moist.  Eyes:     Extraocular Movements: Extraocular movements intact.     Pupils: Pupils are equal, round, and reactive to light.  Cardiovascular:     Rate and Rhythm: Normal rate and regular rhythm.  Pulmonary:     Effort: Pulmonary effort is normal.     Breath sounds: Normal breath sounds.  Abdominal:     General: Bowel sounds are normal.     Palpations: Abdomen is soft.  Musculoskeletal:        General: Normal range of motion.     Cervical back: Normal range of motion and neck supple.  Skin:    General: Skin is warm and dry.  Neurological:     Mental Status: She is alert and oriented to person, place, and time.  Psychiatric:        Mood and Affect: Mood normal.        Behavior:  Behavior normal.        Thought Content: Thought content normal.     Assessment & Plan:  [name] was seen today for new patient (initial visit).  Diagnoses and all orders for this visit:  Encounter to establish care  Gestational diabetes mellitus (GDM), antepartum, gestational diabetes method of control unspecified -  CBC with Differential/Platelet -     Hemoglobin A1c  Obesity (BMI 30.0-34.9) Discussed diet and exercise for person with BMI >25. Instructed: You must burn more calories than you eat. Losing 5 percent of your body weight should be considered a success. In the longer term, losing more than 15 percent of your body weight and staying at this weight is an extremely good result. However, keep in mind that even losing 5 percent of your body weight leads to important health benefits, so try not to get discouraged if you're not able to lose more than this. Will recheck weight in 3-6 months.  -     CBC with Differential/Platelet -     CMP14+EGFR  Screening, lipid -     Lipid panel     Follow-up:  No follow-ups on file.  The above assessment and management plan was discussed with the patient. The patient verbalized understanding of and has agreed to the management plan. Patient is aware to call the clinic if symptoms fail to improve or worsen. Patient is aware when to return to the clinic for a follow-up visit. Patient educated on when it is appropriate to go to the emergency department.   Rosaline Bohr, NP-C

## 2024-07-03 ENCOUNTER — Telehealth (INDEPENDENT_AMBULATORY_CARE_PROVIDER_SITE_OTHER): Payer: Self-pay | Admitting: Primary Care

## 2024-07-03 ENCOUNTER — Encounter (INDEPENDENT_AMBULATORY_CARE_PROVIDER_SITE_OTHER): Payer: Self-pay | Admitting: Primary Care

## 2024-07-03 LAB — CMP14+EGFR
ALT: 20 IU/L (ref 0–32)
AST: 21 IU/L (ref 0–40)
Albumin: 4.1 g/dL (ref 3.9–4.9)
Alkaline Phosphatase: 44 IU/L (ref 44–121)
BUN/Creatinine Ratio: 12 (ref 9–23)
BUN: 10 mg/dL (ref 6–20)
Bilirubin Total: <0.2 mg/dL (ref 0.0–1.2)
CO2: 19 mmol/L — ABNORMAL LOW (ref 20–29)
Calcium: 9 mg/dL (ref 8.7–10.2)
Chloride: 105 mmol/L (ref 96–106)
Creatinine, Ser: 0.82 mg/dL (ref 0.57–1.00)
Globulin, Total: 2.6 g/dL (ref 1.5–4.5)
Glucose: 115 mg/dL — ABNORMAL HIGH (ref 70–99)
Potassium: 3.7 mmol/L (ref 3.5–5.2)
Sodium: 137 mmol/L (ref 134–144)
Total Protein: 6.7 g/dL (ref 6.0–8.5)
eGFR: 98 mL/min/1.73 (ref >59)

## 2024-07-03 LAB — CBC WITH DIFFERENTIAL/PLATELET
Basophils Absolute: 0 x10E3/uL (ref 0.0–0.2)
Basos: 0 %
EOS (ABSOLUTE): 0.1 x10E3/uL (ref 0.0–0.4)
Eos: 2 %
Hematocrit: 42.2 % (ref 34.0–46.6)
Hemoglobin: 13.2 g/dL (ref 11.1–15.9)
Immature Grans (Abs): 0 x10E3/uL (ref 0.0–0.1)
Immature Granulocytes: 0 %
Lymphocytes Absolute: 2.7 x10E3/uL (ref 0.7–3.1)
Lymphs: 52 %
MCH: 28.1 pg (ref 26.6–33.0)
MCHC: 31.3 g/dL — ABNORMAL LOW (ref 31.5–35.7)
MCV: 90 fL (ref 79–97)
Monocytes Absolute: 0.4 x10E3/uL (ref 0.1–0.9)
Monocytes: 7 %
Neutrophils Absolute: 2 x10E3/uL (ref 1.4–7.0)
Neutrophils: 39 %
Platelets: 310 x10E3/uL (ref 150–450)
RBC: 4.69 x10E6/uL (ref 3.77–5.28)
RDW: 12.5 % (ref 11.7–15.4)
WBC: 5.1 x10E3/uL (ref 3.4–10.8)

## 2024-07-03 LAB — HEMOGLOBIN A1C
Est. average glucose Bld gHb Est-mCnc: 114 mg/dL
Hgb A1c MFr Bld: 5.6 % (ref 4.8–5.6)

## 2024-07-03 LAB — LIPID PANEL
Chol/HDL Ratio: 3.1 ratio (ref 0.0–4.4)
Cholesterol, Total: 161 mg/dL (ref 100–199)
HDL: 52 mg/dL (ref >39)
LDL Chol Calc (NIH): 77 mg/dL (ref 0–99)
Triglycerides: 194 mg/dL — ABNORMAL HIGH (ref 0–149)
VLDL Cholesterol Cal: 32 mg/dL (ref 5–40)

## 2024-07-03 NOTE — Telephone Encounter (Signed)
 Provider hasn't reviewed labs as of yet. Made pt aware yesterday in office that labs always come back to mychart faster before provider reviews them. Made pt aware to give the provider 24-48 hours to review labs

## 2024-07-03 NOTE — Telephone Encounter (Unsigned)
 Copied from CRM [id number]. Topic: Clinical - Lab/Test Results >> Jul 03, 2024  9:41 AM Annabella S wrote: Reason for CRM: Patient has questions regarding abnormal lab results please call patient

## 2024-07-05 ENCOUNTER — Ambulatory Visit: Payer: Self-pay | Admitting: Primary Care

## 2024-07-05 ENCOUNTER — Telehealth (INDEPENDENT_AMBULATORY_CARE_PROVIDER_SITE_OTHER): Payer: Self-pay | Admitting: Primary Care

## 2024-07-05 ENCOUNTER — Other Ambulatory Visit: Payer: Self-pay | Admitting: Primary Care

## 2024-07-05 MED ORDER — ATORVASTATIN CALCIUM 20 MG PO TABS: 20.0000 mg | ORAL_TABLET | Freq: Every day | ORAL | 1 refills | Status: AC

## 2024-07-05 NOTE — Telephone Encounter (Signed)
 Will forward to provider

## 2024-07-05 NOTE — Telephone Encounter (Signed)
 Copied from CRM [id number]. Topic: Clinical - Lab/Test Results >> Jul 05, 2024  8:59 AM Cleave MATSU wrote: Reason for CRM: pt called to check on lab results.

## 2024-07-16 ENCOUNTER — Ambulatory Visit (INDEPENDENT_AMBULATORY_CARE_PROVIDER_SITE_OTHER): Admitting: Primary Care

## 2024-07-16 ENCOUNTER — Encounter (INDEPENDENT_AMBULATORY_CARE_PROVIDER_SITE_OTHER): Payer: Self-pay | Admitting: Primary Care

## 2024-07-16 VITALS — BP 130/90 | HR 85 | Wt 189.6 lb

## 2024-07-16 DIAGNOSIS — Z713 Dietary counseling and surveillance: Secondary | ICD-10-CM | POA: Diagnosis not present

## 2024-07-16 DIAGNOSIS — E66811 Obesity, class 1: Secondary | ICD-10-CM | POA: Diagnosis not present

## 2024-07-16 DIAGNOSIS — R030 Elevated blood-pressure reading, without diagnosis of hypertension: Secondary | ICD-10-CM

## 2024-07-16 NOTE — Progress Notes (Signed)
 [facility]   Grace [name], is a 33 y.o. female presents for weight loss management.  Today is her birthday so she celebrated starting yesterday and continuing through today.  Blood pressure is slightly elevated.  Patient has tried to lose weight on her arm by monitoring carbohydrates, no sweets and exercising.  She has become frustrated not being able to lose weight.  This is also caused decrease in self image (weight causing depression) and being comfortable around crowds.  The previous visit her blood pressure was normal.  Will discuss options phentermine, Qsymia, Wegovy, Ze bound and Mounjaro. BP Readings from Last 3 Encounters:  07/02/24 125/84  01/29/24 125/79  01/07/24 133/88    Wt Readings from Last 3 Encounters:  07/02/24 192 lb 9.6 oz (87.4 kg)  01/29/24 196 lb (88.9 kg)  06/17/21 201 lb 3.2 oz (91.3 kg)    Typical breakfast: Smoothly,  boil egg ,steak biscuit Typical lunch: Fast food storage stools Typical dinner: Grilled or baked meat, starch and a vegetable  Medications: Current Outpatient Medications on File Prior to Visit  Medication Sig Dispense Refill   atorvastatin  (LIPITOR) 20 MG tablet Take 1 tablet (20 mg total) by mouth daily. 90 tablet 1   Norethindrone Acetate-Ethinyl Estrad-FE (HAILEY 24 FE) 1-20 MG-MCG(24) tablet Take 1 tablet by mouth daily.     acetaminophen  (TYLENOL ) 325 MG tablet Take 2 tablets (650 mg total) by mouth every 4 (four) hours as needed (for pain scale < 4). (Patient not taking: Reported on 07/02/2024)     cyclobenzaprine  (FLEXERIL ) 10 MG tablet Take 1 tablet (10 mg total) by mouth at bedtime. (Patient not taking: Reported on 07/02/2024) 5 tablet 0   ibuprofen  (ADVIL ) 600 MG tablet Take 1 tablet (600 mg total) by mouth every 6 (six) hours. (Patient not taking: Reported on 07/02/2024) 30 tablet 0   naproxen  (NAPROSYN ) 500 MG tablet Take 1 tablet (500 mg total) by mouth 2 (two) times daily as needed. (Patient not taking:  Reported on 07/02/2024) 14 tablet 0   Prenatal Vit-Fe Fumarate-FA (PRENATAL MULTIVITAMIN) TABS tablet Take 1 tablet by mouth daily at 12 noon. (Patient not taking: Reported on 07/02/2024)     [DISCONTINUED] medroxyPROGESTERone  (DEPO-PROVERA ) 150 MG/ML injection Inject 150 mg into the muscle every 3 (three) months.      No current facility-administered medications on file prior to visit.    ROS:   Denies any headaches, blurred vision, fatigue, shortness of breath, chest pain, abdominal pain, abnormal vaginal discharge/itching/odor/irritation, problems with periods, bowel movements, urination, or intercourse unless otherwise stated above.  Physical exam:  BP (!) 130/90 (BP Location: Left Arm, Patient Position: Sitting, Cuff Size: Normal)   Pulse 85   Wt 189 lb 9.6 oz (86 kg)   SpO2 96%   BMI 32.04 kg/m  General appearance: alert, appears stated age, and mildly obese Eyes: conjunctivae/corneas clear. PERRL, EOM's intact. Fundi benign. Ears: normal TM's and external ear canals both ears Nose: Nares normal. Septum midline. Mucosa normal. No drainage or sinus tenderness. Neck: no adenopathy, no carotid bruit, no JVD, supple, symmetrical, trachea midline, and thyroid not enlarged, symmetric, no tenderness/mass/nodules Lungs: clear to auscultation bilaterally Heart: regular rate and rhythm, S1, S2 normal, no murmur, click, rub or gallop Abdomen: soft, non-tender; bowel sounds normal; no masses,  no organomegaly Extremities: extremities normal, atraumatic, no cyanosis or edema Skin: Skin color, texture, turgor normal. No rashes or lesions Neurologic: Grossly normal  Assessment and Plan:  Grace [name] was seen today for weight mangement.  Diagnoses and all orders for this visit:  Weight loss counseling, encounter for Obesity with co morbid conditions.  General weight loss/lifestyle modification strategies discussed (elicit support from others; identify saboteurs; non-food rewards, etc). Diet  interventions: qualitative changes (increase low-fat,  high-fiber foods). Regular aerobic exercise program discussed. Follow up in: 1 month  and as needed.  Obesity (BMI 30.0-34.9) Obesity is 30-39 indicating an excess in caloric intake or underlining conditions. This may lead to other co-morbidities. Educated on lifestyle modifications of diet and exercise which may reduce obesity.    Elevated BP without diagnosis of hypertension  Discussed risk factors with uncontrol HTN- stroke , heart attack or both . Reviewed how to read labels for sodium content. Counseled on blood pressure goal of less than 130/80, low-sodium, DASH diet, medication compliance, 150 minutes of moderate intensity exercise per week. Discussed medication compliance, adverse effects.    This note has been created with Education officer, environmental. Any transcriptional errors are unintentional.   Grace SHAUNNA Bohr, NP 07/16/2024, 2:42 PM

## 2024-07-16 NOTE — Patient Instructions (Addendum)
 Tirzepatide Injection (Weight Management) What is this medication? TIRZEPATIDE (tir ZEP a tide) promotes weight loss. It may also be used to maintain weight loss.  It works by decreasing appetite. It can be used to treat sleep apnea. Changes to diet and exercise are often combined with this medication. This medicine may be used for other purposes; ask your health care provider or pharmacist if you have questions. COMMON BRAND NAME(S): Zepbound What should I tell my care team before I take this medication? They need to know if you have any of these conditions: Diabetes Eye disease caused by diabetes Gallbladder disease Have or have had depression Have or have had pancreatitis Having surgery Kidney disease Personal or family history of MEN 2, a condition that causes endocrine gland tumors Personal or family history of thyroid cancer Stomach or intestine problems, such as problems digesting food Suicidal thoughts, plans, or attempt An unusual or allergic reaction to tirzepatide, other medications, foods, dyes, or preservatives Pregnant or trying to get pregnant Breastfeeding How should I use this medication? This medication is injected under the skin. You will be taught how to prepare and give it. Take it as directed on the prescription label. Keep taking it unless your care team tells you to stop. It is important that you put your used needles and syringes in a special sharps container. Do not put them in a trash can. If you do not have a sharps container, call your pharmacist or care team to get one. A special MedGuide will be given to you by the pharmacist with each prescription and refill. Be sure to read this information carefully each time. This medication comes with INSTRUCTIONS FOR USE. Ask your pharmacist for directions on how to use this medication. Read the information carefully. Talk to your pharmacist or care team if you have questions. Talk to your care team about the use of this  medication in children. Special care may be needed. Overdosage: If you think you have taken too much of this medicine contact a poison control center or emergency room at once. NOTE: This medicine is only for you. Do not share this medicine with others. What if I miss a dose? If you miss a dose, take it as soon as you can unless it is more than 4 days (96 hours) late. If it is more than 4 days late, skip the missed dose. Take the next dose at the normal time. Do not take 2 doses within 3 days (72 hours) of each other. What may interact with this medication? Certain medications for diabetes, such as insulin, glyburide, glipizide This medication may affect how other medications work. Talk with your care team about all of the medications you take. They may suggest changes to your treatment plan to lower the risk of side effects and to make sure your medications work as intended. This list may not describe all possible interactions. Give your health care provider a list of all the medicines, herbs, non-prescription drugs, or dietary supplements you use. Also tell them if you smoke, drink alcohol, or use illegal drugs. Some items may interact with your medicine. What should I watch for while using this medication? Visit your care team for regular checks on your progress. Tell your care team if your condition does not start to get better or if it gets worse. Tell your care team if you are taking medication to treat diabetes, such as insulin or glipizide. This may increase your risk of low blood sugar. Know the  symptoms of low blood sugar and how to treat it. Talk to your care team about your risk of cancer. You may be more at risk for certain types of cancer if you take this medication. Talk to your care team right away if you have a lump or swelling in your neck, hoarseness that does not go away, trouble swallowing, shortness of breath, or trouble breathing. Make sure you stay hydrated while taking this  medication. Drink water often. Eat fruits and veggies that have a high water content. Drink more water when it is hot or you are active. Talk to your care team right away if you have fever, infection, vomiting, diarrhea, or if you sweat a lot while taking this medication. The loss of too much body fluid may make it dangerous for you to take this medication. If you are going to need surgery or a procedure, tell your care team that you are taking this medication. Estrogen and progestin hormones that you take by mouth may not work as well while you are taking this medication. Switch to a non-oral contraceptive or add a barrier contraceptive for 4 weeks after starting this medication and after each dose increase. Talk to your care team about contraceptive options. They can help you find the option that works for you. Do not take this medication without first talking to your care team if you may be or could become pregnant. Your care team can help you find the option that works for you. Weight loss is not recommended during pregnancy. Talk to your care team if you are breastfeeding. When recommended, this medication may be taken. Its use during breastfeeding has not been well studied. Your care team may suggest other options. What side effects may I notice from receiving this medication? Side effects that you should report to your care team as soon as possible: Allergic reactions--skin rash, itching, hives, swelling of the face, lips, tongue, or throat Change in vision Dehydration--increased thirst, dry mouth, feeling faint or lightheaded, headache, dark yellow or brown urine Fast or irregular heartbeat Gallbladder problems--severe stomach pain, nausea, vomiting, fever Kidney injury--decrease in the amount of urine, swelling of the ankles, hands, or feet Pancreatitis--severe stomach pain that spreads to your back or gets worse after eating or when touched, fever, nausea, vomiting Thoughts of suicide or  self-harm, worsening mood, feelings of depression Thyroid cancer--new mass or lump in the neck, pain or trouble swallowing, trouble breathing, hoarseness Side effects that usually do not require medical attention (report these to your care team if they continue or are bothersome): Constipation Diarrhea Loss of appetite Nausea Upset stomach This list may not describe all possible side effects. Call your doctor for medical advice about side effects. You may report side effects to FDA at 1-800-FDA-1088. Where should I keep my medication? Keep out of the reach of children and pets. Store in a refrigerator or at room temperature up to 30 degrees C (86 degrees F). Keep it in the original container. Protect from light. Refrigeration (preferred): Store in the refrigerator. Do not freeze. Get rid of any unused medication after the expiration date. Room temperature: This medication may be stored at room temperature for up to 21 days. If it is stored at room temperature, get rid of any unused medication after 21 days or after it expires, whichever is first. To get rid of medications that are no longer needed or have expired: Take the medication to a medication take-back program. Check with your pharmacy or law enforcement  to find a location. If you cannot return the medication, ask your pharmacist or care team how to get rid of this medication safely. NOTE: This sheet is a summary. It may not cover all possible information. If you have questions about this medicine, talk to your doctor, pharmacist, or health care provider.  2025 Elsevier/Gold Standard (2023-12-20 00:00:00)Phentermine; Topiramate Extended-Release Capsules What is this medication? Phentermine; Topiramate (FEN ter meen; Toe PYRE a mate) promotes weight loss. It may also be used to maintain weight loss. It works by decreasing appetite. Changes to diet and exercise are often combined with this medication. This medicine may be used for other  purposes; ask your health care provider or pharmacist if you have questions. COMMON BRAND NAME(S): Qsymia What should I tell my care team before I take this medication? They need to know if you have any of these conditions: Depression or other mental health conditions Diabetes Diarrhea Glaucoma Having surgery High thyroid levels History of substance use disorder Kidney disease Kidney stones Liver disease Low levels of potassium in the blood Lung disease Metabolic acidosis On a ketogenic diet Osteoporosis, weak bones Seizures Suicidal thoughts, plans, or attempt by you or a family member Taken an MAOI, such as Marplan, Nardil, or Parnate in the last 14 days Trouble sleeping An unusual or allergic reaction to phentermine, topiramate, other medications, foods, dyes, or preservatives Pregnant or trying to get pregnant Breastfeeding How should I use this medication? Take this medication by mouth with a glass of water. Follow the directions on the prescription label. Do not crush or chew. This medication is usually taken with or without food once per day in the morning. Avoid taking this medication in the evening. It may interfere with sleep. Take your doses at regular intervals. Do not take your medication more often than directed. A special MedGuide will be given to you by the pharmacist with each prescription and refill. Be sure to read this information carefully each time. Talk to your care team about the use of this medication in children. While it may be prescribed for children as young as 12 years for selected conditions, precautions do apply. Overdosage: If you think you have taken too much of this medicine contact a poison control center or emergency room at once. NOTE: This medicine is only for you. Do not share this medicine with others. What if I miss a dose? If you miss a dose, skip it. Take your next dose at the normal time. Do not take extra or 2 doses at the same time to  make up for the missed dose. What may interact with this medication? Do not take this medication with any of the following: MAOIs like Carbex, Eldepryl, Marplan, Nardil, and Parnate This medication may also interact with the following: Acetazolamide Alcohol Antihistamines for allergy, cough and cold Atropine Birth control pills Carbamazepine Certain medications for bladder problems like oxybutynin, tolterodine Certain medications for depression, anxiety, or psychotic disturbances Certain medications for high blood pressure Certain medications for Parkinson disease like benztropine, trihexyphenidyl Certain medications for sleep Certain medications for stomach problems like dicyclomine, hyoscyamine Certain medications for travel sickness like scopolamine Dichlorphenamide Digoxin Diuretics Linezolid Medications for colds or breathing difficulties like pseudoephedrine or phenylephrine  Medications for diabetes Methazolamide Narcotic medications for pain Phenytoin Sibutramine Stimulant medications for attention disorders, weight loss, or to stay awake Valproic acid Zonisamide This list may not describe all possible interactions. Give your health care provider a list of all the medicines, herbs, non-prescription drugs, or dietary  supplements you use. Also tell them if you smoke, drink alcohol, or use illegal drugs. Some items may interact with your medicine. What should I watch for while using this medication? Visit your care team for regular checks on your progress. Do not stop taking this medication except on your care team's advice. You may develop a severe reaction. Your care team will tell you how much medication to take. Do not take this medication close to bedtime. It may prevent you from sleeping. Avoid extreme heat. This medication can cause you to sweat less than normal. Your body temperature could increase to dangerous levels, which may lead to heat stroke. You should drink  plenty of fluids while taking this medication. If you have had kidney stones in the past, this will help to reduce your chances of forming kidney stones. You may get drowsy or dizzy. Do not drive, use machinery, or do anything that needs mental alertness until you know how this medication affects you. Do not stand or sit up quickly, especially if you are an older patient. This reduces the risk of dizzy or fainting spells. Alcohol may increase dizziness and drowsiness. Avoid alcoholic drinks. This medication may affect blood sugar levels. Ask your care team if changes in diet or medications are needed if you have diabetes. Check with your care team if you have severe diarrhea, nausea, and vomiting, or if you sweat a lot. The loss of too much body fluid may make it dangerous for you to take this medication. Tell your care team right away if you have any change in your eyesight. Watch for new or worsening thoughts of suicide or depression. This includes sudden changes in mood, behaviors, or thoughts. These changes can happen at any time but are more common in the beginning of treatment or after a change in dose. Call your care team right away if you experience these thoughts or worsening depression. This medication may cause serious skin reactions. They can happen weeks to months after starting the medication. Contact your care team right away if you notice fevers or flu-like symptoms with a rash. The rash may be red or purple and then turn into blisters or peeling of the skin. Or, you might notice a red rash with swelling of the face, lips or lymph nodes in your neck or under your arms. Birth control may not work properly while you are taking this medication. Talk to your care team about using an extra method of birth control. Tell your care team if you wish to become pregnant or think you might be pregnant. There is a potential for serious side effects and harm to an unborn child. Losing weight while pregnant  is not advised and may cause harm to the unborn child. Talk to your care team for more information. What side effects may I notice from receiving this medication? Side effects that you should report to your care team as soon as possible: Allergic reactions--skin rash, itching, hives, swelling of the face, lips, tongue, or throat Difficulty with paying attention, memory, or speech Fast or irregular heartbeat Fever that does not go away, decreased sweating High acid level--trouble breathing, unusual weakness or fatigue, confusion, headache, fast or irregular heartbeat, nausea, vomiting High ammonia level--unusual weakness or fatigue, confusion, loss of appetite, nausea, vomiting, seizures Kidney injury--decrease in the amount of urine, swelling of the ankles, hands, or feet Kidney stones--blood in the urine, pain or trouble passing urine, pain in the lower back or sides Low potassium level--muscle pain  or cramps, unusual weakness or fatigue, fast or irregular heartbeat, constipation Mood and behavior changes--anxiety, nervousness, confusion, hallucinations, irritability, hostility, thoughts of suicide or self-harm, worsening mood, feelings of depression Redness, blistering, peeling, or loosening of the skin, including inside the mouth Sudden eye pain or change in vision such as blurry vision, seeing halos around lights, vision loss Side effects that usually do not require medical attention (report to your care team if they continue or are bothersome): Burning or tingling sensation in hands or feet Change in taste Constipation Dizziness Dry mouth Trouble sleeping This list may not describe all possible side effects. Call your doctor for medical advice about side effects. You may report side effects to FDA at 1-800-FDA-1088. Where should I keep my medication? Keep out of the reach of children and pets. This medication can be abused. Keep it in a safe place to protect it from theft. Do not share  it with anyone. It is only for you. Selling or giving away this medication is dangerous and against the law. Store at room temperature between 15 and 25 degrees C (59 and 77 degrees F). Protect from moisture. Keep the container tightly closed. This medication may cause harm and death if it is taken by other adults, children, or pets. It is important to get rid of the medication as soon as you no longer need it, or if it is expired. You can do this in two ways: Take the medication to a medication take-back program. Check with your pharmacy or law enforcement to find a location. If you cannot return the medication, check the label or package insert to see if the medication should be thrown out in the garbage or flushed down the toilet. If you are not sure, ask your care team. If it is safe to put in the trash, take the medication out of the container. Mix the medication with cat litter, dirt, coffee grounds, or other unwanted substance. Seal the mixture in a bag or container. Put it in the trash. NOTE: This sheet is a summary. It may not cover all possible information. If you have questions about this medicine, talk to your doctor, pharmacist, or health care provider.  2024 Elsevier/Gold Standard (2023-08-24 00:00:00)Semaglutide Injection (Weight Management) What is this medication? SEMAGLUTIDE (SEM a GLOO tide) promotes weight loss. It may also be used to maintain weight loss.  It works by decreasing appetite. It can be used to lower the risk of heart attack and stroke in people affected by excess weight. Changes to diet and exercise are often combined with this medication. This medicine may be used for other purposes; ask your health care provider or pharmacist if you have questions. COMMON BRAND NAME(S): Tzhncb What should I tell my care team before I take this medication? They need to know if you have any of these conditions: Diabetes Eye disease caused by diabetes Gallbladder disease Have or have  had depression Have or have had pancreatitis Having surgery Kidney disease Personal or family history of MEN 2, a condition that causes endocrine gland tumors Personal or family history of thyroid cancer Stomach or intestine problems, such as problems digesting food Suicidal thoughts, plans, or attempt An unusual or allergic reaction to semaglutide, other medications, foods, dyes, or preservatives Pregnant or trying to get pregnant Breastfeeding How should I use this medication? This medication is injected under the skin. You will be taught how to prepare and give it. Take it as directed on the prescription label. It is given once  every week (every 7 days). Keep taking it unless your care team tells you to stop. It is important that you put your used needles and pens in a special sharps container. Do not put them in a trash can. If you do not have a sharps container, call your pharmacist or care team to get one. A special MedGuide will be given to you by the pharmacist with each prescription and refill. Be sure to read this information carefully each time. This medication comes with INSTRUCTIONS FOR USE. Ask your pharmacist for directions on how to use this medication. Read the information carefully. Talk to your pharmacist or care team if you have questions. Talk to your care team about the use of this medication in children. While it may be prescribed for children as young as 12 years for selected conditions, precautions do apply. Overdosage: If you think you have taken too much of this medicine contact a poison control center or emergency room at once. NOTE: This medicine is only for you. Do not share this medicine with others. What if I miss a dose? If you miss a dose and the next scheduled dose is more than 2 days away, take the missed dose as soon as possible. If you miss a dose and the next scheduled dose is less than 2 days away, do not take the missed dose. Take the next dose at your  regular time. Do not take double or extra doses. If you miss your dose for 2 weeks or more, take the next dose at your regular time or call your care team to talk about how to restart this medication. What may interact with this medication? Insulin and other medications for diabetes This list may not describe all possible interactions. Give your health care provider a list of all the medicines, herbs, non-prescription drugs, or dietary supplements you use. Also tell them if you smoke, drink alcohol, or use illegal drugs. Some items may interact with your medicine. What should I watch for while using this medication? Visit your care team for regular checks on your progress. Tell your care team if your condition does not start to get better or if it gets worse. Tell your care team if you are taking medication to treat diabetes, such as insulin or glipizide. This may increase your risk of low blood sugar. Know the symptoms of low blood sugar and how to treat it. Talk to your care team about your risk of cancer. You may be more at risk for certain types of cancer if you take this medication. Talk to your care team right away if you have a lump or swelling in your neck, hoarseness that does not go away, trouble swallowing, shortness of breath, or trouble breathing. Make sure you stay hydrated while taking this medication. Drink water often. Eat fruits and veggies that have a high water content. Drink more water when it is hot or you are active. Talk to your care team right away if you have fever, infection, vomiting, diarrhea, or if you sweat a lot while taking this medication. The loss of too much body fluid may make it dangerous for you to take this medication. If you are going to need surgery or a procedure, tell your care team that you are taking this medication. Do not take this medication without first talking to your care team if you may be or could become pregnant. Your care team can help you find the  option that works for you. Weight  loss is not recommended during pregnancy. Talk to your care team if you are breastfeeding. When recommended, this medication may be taken. Its use during breastfeeding has not been well studied. Your care team may suggest other options. What side effects may I notice from receiving this medication? Side effects that you should report to your care team as soon as possible: Allergic reactions--skin rash, itching, hives, swelling of the face, lips, tongue, or throat Change in vision Dehydration--increased thirst, dry mouth, feeling faint or lightheaded, headache, dark yellow or brown urine Fast or irregular heartbeat Gallbladder problems--severe stomach pain, nausea, vomiting, fever Kidney injury--decrease in the amount of urine, swelling of the ankles, hands, or feet Pancreatitis--severe stomach pain that spreads to your back or gets worse after eating or when touched, fever, nausea, vomiting Thoughts of suicide or self-harm, worsening mood, feelings of depression Thyroid cancer--new mass or lump in the neck, pain or trouble swallowing, trouble breathing, hoarseness Side effects that usually do not require medical attention (report these to your care team if they continue or are bothersome): Constipation Diarrhea Loss of appetite Nausea Upset stomach This list may not describe all possible side effects. Call your doctor for medical advice about side effects. You may report side effects to FDA at 1-800-FDA-1088. Where should I keep my medication? Keep out of the reach of children and pets. Refrigeration (preferred): Store in the refrigerator. Do not freeze. Keep this medication in the original container until you are ready to take it. Get rid of any unused medication after the expiration date. Room temperature: If needed, prior to cap removal, the pen can be stored at room temperature for up to 28 days. Protect from light. If it is stored at room temperature, get  rid of any unused medication after 28 days or after it expires, whichever is first. It is important to get rid of the medication as soon as you no longer need it or it is expired. You can do this in two ways: Take the medication to a medication take-back program. Check with your pharmacy or law enforcement to find a location. If you cannot return the medication, follow the directions in the MedGuide. NOTE: This sheet is a summary. It may not cover all possible information. If you have questions about this medicine, talk to your doctor, pharmacist, or health care provider.  2025 Elsevier/Gold Standard (2023-11-08 00:00:00)Phentermine Capsules or Tablets What is this medication? PHENTERMINE (FEN ter meen) promotes weight loss. It works by decreasing appetite. It is often used for a short period of time. Changes to diet and exercise are often combined with this medication. This medicine may be used for other purposes; ask your health care provider or pharmacist if you have questions. COMMON BRAND NAME(S): Adipex-P, Atti-Plex P, Atti-Plex P Spansule, Fastin, Lomaira, Pro-Fast, Pro-Fast HS, Pro-Fast SA, Tara-8 What should I tell my care team before I take this medication? They need to know if you have any of these conditions: Agitation or nervousness Diabetes Glaucoma Heart disease High blood pressure History of substance use disorder History of stroke Kidney disease Lung disease called Primary Pulmonary Hypertension (PPH) Taken an MAOI, such as Carbex, Eldepryl, Marplan, Nardil, or Parnate in last 14 days Taking stimulant medications for attention disorders, weight loss, or to stay awake Thyroid disease An unusual or allergic reaction to phentermine, other medications, foods, dyes, or preservatives Pregnant or trying to get pregnant Breastfeeding How should I use this medication? Take this medication by mouth with a glass of water. Follow the  directions on the prescription label. Take your  medication at regular intervals. Do not take it more often than directed. Do not stop taking except on your care team's advice. Talk to your care team about the use of this medication in children. While this medication may be prescribed for children 17 years or older for selected conditions, precautions do apply. Overdosage: If you think you have taken too much of this medicine contact a poison control center or emergency room at once. NOTE: This medicine is only for you. Do not share this medicine with others. What if I miss a dose? If you miss a dose, take it as soon as you can. If it is almost time for your next dose, take only that dose. Do not take double or extra doses. What may interact with this medication? Do not take this medication with any of the following: MAOIs, such as Carbex, Eldepryl, Marplan, Nardil, and Parnate This medication may also interact with the following: Alcohol Certain medications for depression, anxiety, or other mental health conditions Certain medications for blood pressure Linezolid Medications for colds or breathing difficulties, such as pseudoephedrine or phenylephrine  Medications for diabetes Sibutramine Stimulant medications for ADHD, weight loss, or staying awake This list may not describe all possible interactions. Give your health care provider a list of all the medicines, herbs, non-prescription drugs, or dietary supplements you use. Also tell them if you smoke, drink alcohol, or use illegal drugs. Some items may interact with your medicine. What should I watch for while using this medication? Visit your care team for regular checks on your progress. Do not stop taking except on your care team's advice. You may develop a severe reaction. Your care team will tell you how much medication to take. Do not take this medication close to bedtime. It may prevent you from sleeping. This medication may affect your coordination, reaction time, or judgment. Do not  drive or operate machinery until you know how this medication affects you. Sit up or stand slowly to reduce the risk of dizzy or fainting spells. Drinking alcohol with this medication can increase the risk of these side effects. This medication may affect blood sugar levels. Ask your care team if changes in diet or medications are needed if you have diabetes. Inform your care team if you wish to become pregnant or think you might be pregnant. Losing weight while pregnant is not advised and may cause harm to the unborn child. Talk to your care team for more information. What side effects may I notice from receiving this medication? Side effects that you should report to your care team as soon as possible: Allergic reactions--skin rash, itching, hives, swelling of the face, lips, tongue, or throat Heart valve disease--shortness of breath, chest pain, unusual weakness or fatigue, dizziness, feeling faint or lightheaded, fever, sudden weight gain, fast or irregular heartbeat Pulmonary hypertension--shortness of breath, chest pain, fast or irregular heartbeat, feeling faint or lightheaded, fatigue, swelling of the ankles or feet Side effects that usually do not require medical attention (report to your care team if they continue or are bothersome): Change in taste Diarrhea Dizziness Dry mouth Restlessness Trouble sleeping This list may not describe all possible side effects. Call your doctor for medical advice about side effects. You may report side effects to FDA at 1-800-FDA-1088. Where should I keep my medication? Keep out of the reach of children. This medication can be abused. Keep your medication in a safe place to protect it from theft. Do not  share this medication with anyone. Selling or giving away this medication is dangerous and against the law. This medication may cause harm and death if it is taken by other adults, children, or pets. Return medication that has not been used to an official  disposal site. Contact the DEA at 867-316-7059 or your city/county government to find a site. If you cannot return the medication, mix any unused medication with a substance like cat litter or coffee grounds. Then throw the medication away in a sealed container like a sealed bag or coffee can with a lid. Do not use the medication after the expiration date. Store at room temperature between 20 and 25 degrees C (68 and 77 degrees F). Keep container tightly closed. NOTE: This sheet is a summary. It may not cover all possible information. If you have questions about this medicine, talk to your doctor, pharmacist, or health care provider.  2024 [employer] (2022-06-02 00:00:00)

## 2024-08-15 ENCOUNTER — Telehealth (INDEPENDENT_AMBULATORY_CARE_PROVIDER_SITE_OTHER): Payer: Self-pay | Admitting: Primary Care

## 2024-08-15 NOTE — Telephone Encounter (Signed)
 Called pt to confirm appt. Pt will not be present.

## 2024-08-16 ENCOUNTER — Ambulatory Visit (INDEPENDENT_AMBULATORY_CARE_PROVIDER_SITE_OTHER): Admitting: Primary Care
